# Patient Record
Sex: Female | Born: 1950 | Race: White | Hispanic: No | Smoking: Former smoker
Health system: Southern US, Community
[De-identification: ages and names within clinical notes are randomized; demographics above are authoritative.]

## PROBLEM LIST (undated history)

## (undated) DIAGNOSIS — Z9889 Other specified postprocedural states: Secondary | ICD-10-CM

## (undated) DIAGNOSIS — IMO0001 Reserved for inherently not codable concepts without codable children: Secondary | ICD-10-CM

## (undated) DIAGNOSIS — E049 Nontoxic goiter, unspecified: Secondary | ICD-10-CM

## (undated) DIAGNOSIS — M1990 Unspecified osteoarthritis, unspecified site: Secondary | ICD-10-CM

## (undated) DIAGNOSIS — R112 Nausea with vomiting, unspecified: Secondary | ICD-10-CM

## (undated) DIAGNOSIS — K219 Gastro-esophageal reflux disease without esophagitis: Secondary | ICD-10-CM

## (undated) DIAGNOSIS — F329 Major depressive disorder, single episode, unspecified: Secondary | ICD-10-CM

## (undated) DIAGNOSIS — J45909 Unspecified asthma, uncomplicated: Secondary | ICD-10-CM

## (undated) DIAGNOSIS — G4730 Sleep apnea, unspecified: Secondary | ICD-10-CM

## (undated) DIAGNOSIS — Z85828 Personal history of other malignant neoplasm of skin: Secondary | ICD-10-CM

## (undated) DIAGNOSIS — R200 Anesthesia of skin: Secondary | ICD-10-CM

## (undated) DIAGNOSIS — M8580 Other specified disorders of bone density and structure, unspecified site: Secondary | ICD-10-CM

## (undated) DIAGNOSIS — F32A Depression, unspecified: Secondary | ICD-10-CM

## (undated) DIAGNOSIS — E7800 Pure hypercholesterolemia, unspecified: Secondary | ICD-10-CM

## (undated) DIAGNOSIS — C4490 Unspecified malignant neoplasm of skin, unspecified: Secondary | ICD-10-CM

## (undated) DIAGNOSIS — G47419 Narcolepsy without cataplexy: Secondary | ICD-10-CM

## (undated) HISTORY — DX: Unspecified malignant neoplasm of skin, unspecified: C44.90

## (undated) HISTORY — PX: COLONOSCOPY: SHX5424

## (undated) HISTORY — PX: FRACTURE SURGERY: SHX138

## (undated) HISTORY — PX: ABDOMINAL HYSTERECTOMY: SHX81

## (undated) HISTORY — PX: KNEE ARTHROSCOPY: SUR90

---

## 2008-01-07 HISTORY — PX: ANKLE FRACTURE SURGERY: SHX122

## 2011-07-24 ENCOUNTER — Observation Stay (HOSPITAL_COMMUNITY)
Admission: EM | Admit: 2011-07-24 | Discharge: 2011-07-25 | DRG: 227 | Disposition: A | Discharge status: Home / Self Care | Payer: BC Managed Care – PPO | Attending: Orthopedic Surgery | Admitting: Orthopedic Surgery

## 2011-07-24 ENCOUNTER — Encounter (HOSPITAL_COMMUNITY): Payer: Self-pay | Admitting: Anesthesiology

## 2011-07-24 ENCOUNTER — Emergency Department (HOSPITAL_COMMUNITY): Payer: BC Managed Care – PPO

## 2011-07-24 ENCOUNTER — Encounter (HOSPITAL_COMMUNITY): Payer: Self-pay | Admitting: *Deleted

## 2011-07-24 ENCOUNTER — Emergency Department (HOSPITAL_COMMUNITY): Payer: BC Managed Care – PPO | Admitting: Anesthesiology

## 2011-07-24 ENCOUNTER — Encounter (HOSPITAL_COMMUNITY)
Admission: EM | Disposition: A | Discharge status: Home / Self Care | Payer: Self-pay | Source: Home / Self Care | Attending: Emergency Medicine

## 2011-07-24 DIAGNOSIS — Z0389 Encounter for observation for other suspected diseases and conditions ruled out: Secondary | ICD-10-CM | POA: Diagnosis not present

## 2011-07-24 DIAGNOSIS — G47419 Narcolepsy without cataplexy: Secondary | ICD-10-CM

## 2011-07-24 DIAGNOSIS — Y9229 Other specified public building as the place of occurrence of the external cause: Secondary | ICD-10-CM | POA: Diagnosis not present

## 2011-07-24 DIAGNOSIS — E7800 Pure hypercholesterolemia, unspecified: Secondary | ICD-10-CM | POA: Diagnosis not present

## 2011-07-24 DIAGNOSIS — W010XXA Fall on same level from slipping, tripping and stumbling without subsequent striking against object, initial encounter: Secondary | ICD-10-CM | POA: Diagnosis not present

## 2011-07-24 DIAGNOSIS — S82009A Unspecified fracture of unspecified patella, initial encounter for closed fracture: Principal | ICD-10-CM | POA: Insufficient documentation

## 2011-07-24 DIAGNOSIS — M79609 Pain in unspecified limb: Secondary | ICD-10-CM | POA: Diagnosis present

## 2011-07-24 HISTORY — DX: Pure hypercholesterolemia, unspecified: E78.00

## 2011-07-24 HISTORY — DX: Unspecified osteoarthritis, unspecified site: M19.90

## 2011-07-24 HISTORY — PX: PATELLECTOMY: SHX1022

## 2011-07-24 HISTORY — DX: Narcolepsy without cataplexy: G47.419

## 2011-07-24 LAB — CBC
HCT: 39.2 % (ref 36.0–46.0)
Hemoglobin: 13 g/dL (ref 12.0–15.0)
MCH: 29 pg (ref 26.0–34.0)
MCV: 87.3 fL (ref 78.0–100.0)
RBC: 4.49 MIL/uL (ref 3.87–5.11)

## 2011-07-24 LAB — BASIC METABOLIC PANEL
BUN: 15 mg/dL (ref 6–23)
CO2: 23 mEq/L (ref 19–32)
Calcium: 9.5 mg/dL (ref 8.4–10.5)
Glucose, Bld: 108 mg/dL — ABNORMAL HIGH (ref 70–99)
Sodium: 138 mEq/L (ref 135–145)

## 2011-07-24 LAB — PROTIME-INR: INR: 0.97 (ref 0.00–1.49)

## 2011-07-24 SURGERY — PATELLECTOMY
Anesthesia: General | Site: Knee | Laterality: Left | Wound class: Clean

## 2011-07-24 MED ORDER — AMPHETAMINE-DEXTROAMPHETAMINE 20 MG PO TABS
20.0000 mg | ORAL_TABLET | Freq: Every day | ORAL | Status: DC
  Administered 2011-07-25: 20 mg via ORAL
  Filled 2011-07-24: qty 1

## 2011-07-24 MED ORDER — KETOROLAC TROMETHAMINE 30 MG/ML IJ SOLN
15.0000 mg | Freq: Once | INTRAMUSCULAR | Status: AC | PRN
  Administered 2011-07-24: 30 mg via INTRAVENOUS

## 2011-07-24 MED ORDER — CELECOXIB 200 MG PO CAPS
200.0000 mg | ORAL_CAPSULE | Freq: Two times a day (BID) | ORAL | Status: DC
  Administered 2011-07-25 (×2): 200 mg via ORAL
  Filled 2011-07-24 (×3): qty 1

## 2011-07-24 MED ORDER — DOCUSATE SODIUM 100 MG PO CAPS
100.0000 mg | ORAL_CAPSULE | Freq: Two times a day (BID) | ORAL | Status: DC
  Administered 2011-07-25 (×2): 100 mg via ORAL

## 2011-07-24 MED ORDER — ACETAMINOPHEN 10 MG/ML IV SOLN
INTRAVENOUS | Status: AC
  Filled 2011-07-24: qty 100

## 2011-07-24 MED ORDER — ONDANSETRON HCL 4 MG PO TABS: 4.0000 mg | ORAL_TABLET | Freq: Four times a day (QID) | ORAL | Status: DC | PRN

## 2011-07-24 MED ORDER — KETOROLAC TROMETHAMINE 30 MG/ML IJ SOLN
INTRAMUSCULAR | Status: AC
  Filled 2011-07-24: qty 1

## 2011-07-24 MED ORDER — DIPHENHYDRAMINE HCL 50 MG/ML IJ SOLN
INTRAMUSCULAR | Status: DC | PRN
  Administered 2011-07-24: 12.5 mg via INTRAVENOUS

## 2011-07-24 MED ORDER — METHOCARBAMOL 500 MG PO TABS
500.0000 mg | ORAL_TABLET | Freq: Four times a day (QID) | ORAL | Status: DC | PRN
  Administered 2011-07-25: 500 mg via ORAL
  Filled 2011-07-24: qty 1

## 2011-07-24 MED ORDER — PROMETHAZINE HCL 25 MG/ML IJ SOLN: 6.2500 mg | INTRAMUSCULAR | Status: DC | PRN

## 2011-07-24 MED ORDER — CEFAZOLIN SODIUM 1-5 GM-% IV SOLN
INTRAVENOUS | Status: DC | PRN
  Administered 2011-07-24: 2 g via INTRAVENOUS

## 2011-07-24 MED ORDER — ACETAMINOPHEN 10 MG/ML IV SOLN
INTRAVENOUS | Status: DC | PRN
  Administered 2011-07-24: 1000 mg via INTRAVENOUS

## 2011-07-24 MED ORDER — METHOCARBAMOL 100 MG/ML IJ SOLN
500.0000 mg | Freq: Four times a day (QID) | INTRAVENOUS | Status: DC | PRN
  Filled 2011-07-24: qty 5

## 2011-07-24 MED ORDER — HYDROMORPHONE HCL PF 1 MG/ML IJ SOLN
1.0000 mg | Freq: Once | INTRAMUSCULAR | Status: AC
  Administered 2011-07-24: 1 mg via INTRAVENOUS
  Filled 2011-07-24: qty 1

## 2011-07-24 MED ORDER — LACTATED RINGERS IV SOLN: INTRAVENOUS | Status: DC

## 2011-07-24 MED ORDER — HYDROMORPHONE HCL PF 1 MG/ML IJ SOLN: 0.2500 mg | INTRAMUSCULAR | Status: DC | PRN

## 2011-07-24 MED ORDER — TEMAZEPAM 15 MG PO CAPS: 15.0000 mg | ORAL_CAPSULE | Freq: Every evening | ORAL | Status: DC | PRN

## 2011-07-24 MED ORDER — MIDAZOLAM HCL 5 MG/5ML IJ SOLN
INTRAMUSCULAR | Status: DC | PRN
  Administered 2011-07-24: 2 mg via INTRAVENOUS

## 2011-07-24 MED ORDER — ATORVASTATIN CALCIUM 40 MG PO TABS
40.0000 mg | ORAL_TABLET | Freq: Every day | ORAL | Status: DC
  Filled 2011-07-24: qty 1

## 2011-07-24 MED ORDER — DIPHENHYDRAMINE HCL 12.5 MG/5ML PO ELIX
12.5000 mg | ORAL_SOLUTION | ORAL | Status: DC | PRN
Start: 2011-07-24 — End: 2011-07-25

## 2011-07-24 MED ORDER — FENTANYL CITRATE 0.05 MG/ML IJ SOLN
INTRAMUSCULAR | Status: DC | PRN
  Administered 2011-07-24 (×2): 50 ug via INTRAVENOUS

## 2011-07-24 MED ORDER — LIDOCAINE HCL (CARDIAC) 20 MG/ML IV SOLN
INTRAVENOUS | Status: DC | PRN
  Administered 2011-07-24: 100 mg via INTRAVENOUS

## 2011-07-24 MED ORDER — DEXAMETHASONE SODIUM PHOSPHATE 10 MG/ML IJ SOLN
INTRAMUSCULAR | Status: DC | PRN
  Administered 2011-07-24: 10 mg via INTRAVENOUS

## 2011-07-24 MED ORDER — VENLAFAXINE HCL ER 37.5 MG PO CP24
37.5000 mg | ORAL_CAPSULE | Freq: Every day | ORAL | Status: DC
  Administered 2011-07-25: 37.5 mg via ORAL
  Filled 2011-07-24: qty 1

## 2011-07-24 MED ORDER — MODAFINIL 200 MG PO TABS
200.0000 mg | ORAL_TABLET | Freq: Every day | ORAL | Status: DC
  Administered 2011-07-25: 200 mg via ORAL
  Filled 2011-07-24: qty 1

## 2011-07-24 MED ORDER — METOCLOPRAMIDE HCL 10 MG PO TABS: 5.0000 mg | ORAL_TABLET | Freq: Three times a day (TID) | ORAL | Status: DC | PRN

## 2011-07-24 MED ORDER — METOCLOPRAMIDE HCL 5 MG/ML IJ SOLN: 5.0000 mg | Freq: Three times a day (TID) | INTRAMUSCULAR | Status: DC | PRN

## 2011-07-24 MED ORDER — ONDANSETRON HCL 4 MG/2ML IJ SOLN: 4.0000 mg | Freq: Four times a day (QID) | INTRAMUSCULAR | Status: DC | PRN

## 2011-07-24 MED ORDER — CEFAZOLIN SODIUM-DEXTROSE 2-3 GM-% IV SOLR
2.0000 g | Freq: Four times a day (QID) | INTRAVENOUS | Status: DC
  Administered 2011-07-25 (×2): 2 g via INTRAVENOUS
  Filled 2011-07-24 (×3): qty 50

## 2011-07-24 MED ORDER — ONDANSETRON HCL 4 MG/2ML IJ SOLN
INTRAMUSCULAR | Status: DC | PRN
  Administered 2011-07-24: 4 mg via INTRAVENOUS

## 2011-07-24 MED ORDER — BUPIVACAINE HCL (PF) 0.25 % IJ SOLN
INTRAMUSCULAR | Status: DC | PRN
  Administered 2011-07-24: 30 mL

## 2011-07-24 MED ORDER — SUCCINYLCHOLINE CHLORIDE 20 MG/ML IJ SOLN
INTRAMUSCULAR | Status: DC | PRN
  Administered 2011-07-24: 100 mg via INTRAVENOUS

## 2011-07-24 MED ORDER — LACTATED RINGERS IV SOLN
INTRAVENOUS | Status: DC | PRN
  Administered 2011-07-24: 21:00:00 via INTRAVENOUS

## 2011-07-24 MED ORDER — 0.9 % SODIUM CHLORIDE (POUR BTL) OPTIME
TOPICAL | Status: DC | PRN
  Administered 2011-07-24: 1000 mL

## 2011-07-24 MED ORDER — OXYCODONE-ACETAMINOPHEN 5-325 MG PO TABS
1.0000 | ORAL_TABLET | ORAL | Status: DC | PRN
  Administered 2011-07-25: 2 via ORAL
  Administered 2011-07-25: 1 via ORAL
  Administered 2011-07-25: 2 via ORAL
  Filled 2011-07-24: qty 1
  Filled 2011-07-24 (×2): qty 2

## 2011-07-24 MED ORDER — BUPIVACAINE HCL (PF) 0.25 % IJ SOLN
INTRAMUSCULAR | Status: AC
  Filled 2011-07-24: qty 30

## 2011-07-24 MED ORDER — AMPHETAMINE-DEXTROAMPHET ER 20 MG PO CP24
20.0000 mg | ORAL_CAPSULE | ORAL | Status: DC
  Filled 2011-07-24: qty 1

## 2011-07-24 MED ORDER — PROPOFOL 10 MG/ML IV EMUL
INTRAVENOUS | Status: DC | PRN
  Administered 2011-07-24: 150 mg via INTRAVENOUS

## 2011-07-24 MED ORDER — ONDANSETRON HCL 4 MG/2ML IJ SOLN
4.0000 mg | Freq: Once | INTRAMUSCULAR | Status: AC
  Administered 2011-07-24: 4 mg via INTRAVENOUS
  Filled 2011-07-24: qty 2

## 2011-07-24 MED ORDER — CEFAZOLIN SODIUM-DEXTROSE 2-3 GM-% IV SOLR
INTRAVENOUS | Status: AC
  Filled 2011-07-24: qty 50

## 2011-07-24 MED ORDER — HYDROMORPHONE HCL PF 1 MG/ML IJ SOLN: 0.5000 mg | INTRAMUSCULAR | Status: DC | PRN

## 2011-07-24 MED ORDER — BUPIVACAINE-EPINEPHRINE 0.25% -1:200000 IJ SOLN
INTRAMUSCULAR | Status: AC
  Filled 2011-07-24: qty 1

## 2011-07-24 SURGICAL SUPPLY — 56 items
BAG ZIPLOCK 12X15 (MISCELLANEOUS) IMPLANT
BANDAGE ELASTIC 6 VELCRO ST LF (GAUZE/BANDAGES/DRESSINGS) ×3 IMPLANT
BANDAGE ESMARK 6X9 LF (GAUZE/BANDAGES/DRESSINGS) ×2 IMPLANT
BANDAGE GAUZE ELAST BULKY 4 IN (GAUZE/BANDAGES/DRESSINGS) IMPLANT
BIT DRILL 2.4X128 (BIT) ×3 IMPLANT
BNDG COHESIVE 6X5 TAN STRL LF (GAUZE/BANDAGES/DRESSINGS) ×3 IMPLANT
BNDG ESMARK 6X9 LF (GAUZE/BANDAGES/DRESSINGS) ×3
CLOTH BEACON ORANGE TIMEOUT ST (SAFETY) ×3 IMPLANT
CLSR STERI-STRIP ANTIMIC 1/2X4 (GAUZE/BANDAGES/DRESSINGS) ×3 IMPLANT
DECANTER SPIKE VIAL GLASS SM (MISCELLANEOUS) IMPLANT
DRAPE C-ARM 42X72 X-RAY (DRAPES) IMPLANT
DRAPE ORTHO SPLIT 77X108 STRL (DRAPES) ×2
DRAPE POUCH INSTRU U-SHP 10X18 (DRAPES) ×3 IMPLANT
DRAPE SURG ORHT 6 SPLT 77X108 (DRAPES) ×4 IMPLANT
DRSG ADAPTIC 3X8 NADH LF (GAUZE/BANDAGES/DRESSINGS) ×3 IMPLANT
DRSG EMULSION OIL 3X16 NADH (GAUZE/BANDAGES/DRESSINGS) ×3 IMPLANT
DRSG PAD ABDOMINAL 8X10 ST (GAUZE/BANDAGES/DRESSINGS) ×6 IMPLANT
DURAPREP 26ML APPLICATOR (WOUND CARE) ×3 IMPLANT
ELECT REM PT RETURN 9FT ADLT (ELECTROSURGICAL) ×3
ELECTRODE REM PT RTRN 9FT ADLT (ELECTROSURGICAL) ×2 IMPLANT
EVACUATOR 1/8 PVC DRAIN (DRAIN) IMPLANT
EVACUATOR SILICONE 100CC (DRAIN) IMPLANT
GLOVE BIO SURGEON STRL SZ7.5 (GLOVE) ×3 IMPLANT
GLOVE BIO SURGEON STRL SZ8 (GLOVE) ×3 IMPLANT
GLOVE BIOGEL PI IND STRL 7.0 (GLOVE) ×2 IMPLANT
GLOVE BIOGEL PI INDICATOR 7.0 (GLOVE) ×1
GLOVE ECLIPSE 7.5 STRL STRAW (GLOVE) ×3 IMPLANT
GLOVE EUDERMIC 7 POWDERFREE (GLOVE) ×3 IMPLANT
GLOVE SURG SS PI 7.0 STRL IVOR (GLOVE) ×3 IMPLANT
GOWN STRL NON-REIN LRG LVL3 (GOWN DISPOSABLE) ×9 IMPLANT
IMMOBILIZER KNEE 22 UNIV (SOFTGOODS) ×3 IMPLANT
MANIFOLD NEPTUNE II (INSTRUMENTS) IMPLANT
NEEDLE ANCHOR KEITH 2 7/8 STR (NEEDLE) ×3 IMPLANT
NEEDLE HYPO 22GX1.5 SAFETY (NEEDLE) ×3 IMPLANT
NS IRRIG 1000ML POUR BTL (IV SOLUTION) ×3 IMPLANT
PACK LOWER EXTREMITY WL (CUSTOM PROCEDURE TRAY) ×3 IMPLANT
PADDING CAST ABS 6INX4YD NS (CAST SUPPLIES) ×1
PADDING CAST ABS COTTON 6X4 NS (CAST SUPPLIES) ×2 IMPLANT
PASSER SUT SWANSON 36MM LOOP (INSTRUMENTS) IMPLANT
POSITIONER SURGICAL ARM (MISCELLANEOUS) ×3 IMPLANT
SPONGE GAUZE 4X4 12PLY (GAUZE/BANDAGES/DRESSINGS) IMPLANT
SPONGE LAP 18X18 X RAY DECT (DISPOSABLE) ×6 IMPLANT
SPONGE LAP 4X18 X RAY DECT (DISPOSABLE) IMPLANT
SPONGE SURGIFOAM ABS GEL 100 (HEMOSTASIS) IMPLANT
STAPLER VISISTAT 35W (STAPLE) IMPLANT
STRIP CLOSURE SKIN 1/2X4 (GAUZE/BANDAGES/DRESSINGS) ×6 IMPLANT
SUT BONE WAX W31G (SUTURE) IMPLANT
SUT MNCRL AB 3-0 PS2 18 (SUTURE) ×3 IMPLANT
SUT VIC AB 0 CT1 27 (SUTURE)
SUT VIC AB 0 CT1 27XBRD ANTBC (SUTURE) IMPLANT
SUT VIC AB 1 CT1 27 (SUTURE) ×2
SUT VIC AB 1 CT1 27XBRD ANTBC (SUTURE) ×4 IMPLANT
SUT VIC AB 2-0 CT1 27 (SUTURE) ×2
SUT VIC AB 2-0 CT1 TAPERPNT 27 (SUTURE) ×4 IMPLANT
SYR CONTROL 10ML LL (SYRINGE) ×3 IMPLANT
WATER STERILE IRR 1500ML POUR (IV SOLUTION) IMPLANT

## 2011-07-24 NOTE — Anesthesia Postprocedure Evaluation (Signed)
  Anesthesia Post-op Note  Patient: Carla [name]  Procedure(s) Performed: Procedure(s) (LRB): PATELLECTOMY (Left)  Patient Location: PACU  Anesthesia Type: General  Level of Consciousness: awake and alert   Airway and Oxygen Therapy: Patient Spontanous Breathing  Post-op Pain: mild  Post-op Assessment: Post-op Vital signs reviewed, Patient's Cardiovascular Status Stable, Respiratory Function Stable, Patent Airway and No signs of Nausea or vomiting  Post-op Vital Signs: stable  Complications: No apparent anesthesia complications

## 2011-07-24 NOTE — Preoperative (Signed)
Beta Blockers   Reason not to administer Beta Blockers:Not Applicable 

## 2011-07-24 NOTE — ED Notes (Signed)
Pt slipped at [facility] on water and injured left knee. EMS reports obvious deformity noted. 22G L hand by EMS.

## 2011-07-24 NOTE — Transfer of Care (Signed)
Immediate Anesthesia Transfer of Care Note  Patient: Carla [name]  Procedure(s) Performed: Procedure(s) (LRB): PATELLECTOMY (Left)  Patient Location: PACU  Anesthesia Type: General  Level of Consciousness: awake, alert , oriented and patient cooperative  Airway & Oxygen Therapy: Patient Spontanous Breathing and Patient connected to face mask oxygen  Post-op Assessment: Report given to PACU RN, Post -op Vital signs reviewed and stable and Patient moving all extremities  Post vital signs: Reviewed  Complications: No apparent anesthesia complications

## 2011-07-24 NOTE — H&P (Signed)
Carla [name]   Chief Complaint: fractured left patella HPI: The patient is a 61 y.o. female patient reports slipping in a puddle of water on the floor at [employer] and falling directly onto her left knee. Denies other injuries sustained in the fall. Heard a "crunch" and has not ambulated since the fall. There is associated swelling. Denies numbness distal to the injury. Pain is severe and constant. Worse with attempted movement. Temporarily relieved with fentanyl administered by EMS.   Past Medical History  Diagnosis Date  . Narcolepsy   . Hypercholesteremia   . Arthritis     Past Surgical History  Procedure Date  . Abdominal hysterectomy     History reviewed. No pertinent family history.  Social History:  does not have a smoking history on file. She does not have any smokeless tobacco history on file. She reports that she drinks alcohol. She reports that she does not use illicit drugs.  Allergies: No Known Allergies   (Not in a hospital admission)   Physical Exam: [facility] A and O. Ortho exam limited to LLE with prepatellar ecchymosis and marked swelling, skin intact. N/V intact distally.  Vitals  Temp:  [97.9 F (36.6 C)] 97.9 F (36.6 C) (07/18 1732) Pulse Rate:  [78] 78  (07/18 1732) Resp:  [15] 15  (07/18 1732) BP: (140)/(59) 140/59 mmHg (07/18 1732) SpO2:  [98 %-99 %] 98 % (07/18 1732)  Assessment/Plan  Impression: fractured left patella  Plan of Action: Procedure(s): OPEN REDUCTION INTERNAL (ORIF) FIXATION PATELLA  Sammye Staff M 07/24/2011, 8:16 PM

## 2011-07-24 NOTE — Anesthesia Preprocedure Evaluation (Addendum)
Anesthesia Evaluation  Patient identified by MRN, date of birth, ID band Patient awake    Reviewed: Allergy & Precautions, H&P , NPO status , Patient's Chart, lab work & pertinent test results  Airway Mallampati: II TM Distance: <3 FB Neck ROM: Full    Dental No notable dental hx.    Pulmonary shortness of breath and lying,  breath sounds clear to auscultation  Pulmonary exam normal       Cardiovascular negative cardio ROS  Rhythm:Regular Rate:Normal     Neuro/Psych narcolepsy negative psych ROS   GI/Hepatic negative GI ROS, Neg liver ROS,   Endo/Other  Morbid obesity  Renal/GU negative Renal ROS  negative genitourinary   Musculoskeletal negative musculoskeletal ROS (+)   Abdominal   Peds negative pediatric ROS (+)  Hematology negative hematology ROS (+)   Anesthesia Other Findings   Reproductive/Obstetrics negative OB ROS                          Anesthesia Physical Anesthesia Plan  ASA: II and Emergent  Anesthesia Plan: General   Post-op Pain Management:    Induction: Intravenous  Airway Management Planned: Oral ETT  Additional Equipment:   Intra-op Plan:   Post-operative Plan: Extubation in OR  Informed Consent: I have reviewed the patients History and Physical, chart, labs and discussed the procedure including the risks, benefits and alternatives for the proposed anesthesia with the patient or authorized representative who has indicated his/her understanding and acceptance.   Dental advisory given  Plan Discussed with: CRNA and Surgeon  Anesthesia Plan Comments:        Anesthesia Quick Evaluation

## 2011-07-24 NOTE — Op Note (Signed)
07/24/2011  10:20 PM  PATIENT:   Carla [name]  61 y.o. female  PRE-OPERATIVE DIAGNOSIS:  fractured left patella  POST-OPERATIVE DIAGNOSIS:  same  PROCEDURE:  Partial patellectomy and repair of infrapatellar tendon  SURGEON:  Shayonna Ocampo, Vania Rea M.D.  ASSISTANTS: Shuford pac   ANESTHESIA:   GET + local  EBL: min  SPECIMEN:  none  Drains: none   PATIENT DISPOSITION:  PACU - hemodynamically stable.    PLAN OF CARE: Admit for overnight observation  Dictation# [id number] + [id number]

## 2011-07-24 NOTE — ED Provider Notes (Addendum)
History     CSN: [id number]  Arrival date & time 07/24/11  1724   First MD Initiated Contact with Patient 07/24/11 1744      Chief Complaint  Patient presents with  . Fall  . Knee Injury    (Consider location/radiation/quality/duration/timing/severity/associated sxs/prior treatment) Patient is a 61 y.o. female presenting with fall. The history is provided by the patient.  Fall The accident occurred less than 1 hour ago. The fall occurred while walking. There was no blood loss.   patient reports slipping in a puddle of water on the floor at [employer] and falling directly onto her left knee. Denies other injuries sustained in the fall. Heard a "crunch" and has not ambulated since the fall. There is associated swelling. Denies numbness distal to the injury. Pain is severe and constant. Worse with attempted movement. Temporarily relieved with fentanyl administered by EMS.  Past Medical History  Diagnosis Date  . Narcolepsy   . Hypercholesteremia   . Arthritis     Past Surgical History  Procedure Date  . Abdominal hysterectomy     History reviewed. No pertinent family history.  History  Substance Use Topics  . Smoking status: Not on file  . Smokeless tobacco: Not on file  . Alcohol Use: Yes     Review of Systems 10 systems reviewed and are negative for acute change except as noted in the HPI.  Allergies  Review of patient's allergies indicates no known allergies.  Home Medications   Current Outpatient Rx  Name Route Sig Dispense Refill  . AMPHETAMINE-DEXTROAMPHET ER 20 MG PO CP24 Oral Take 20 mg by mouth every morning.    Marland Kitchen AMPHETAMINE-DEXTROAMPHETAMINE 20 MG PO TABS Oral Take 20 mg by mouth daily.    . ATORVASTATIN CALCIUM 40 MG PO TABS Oral Take 40 mg by mouth daily.    . CELECOXIB 200 MG PO CAPS Oral Take 200 mg by mouth 2 (two) times daily.    Marland Kitchen MODAFINIL 200 MG PO TABS Oral Take 200 mg by mouth daily.    . VENLAFAXINE HCL ER 37.5 MG PO CP24 Oral Take 37.5  mg by mouth daily.      BP 140/59  Pulse 78  Temp 97.9 F (36.6 C) (Oral)  Resp 15  SpO2 98%  Physical Exam  Nursing note and vitals reviewed. Constitutional: She is oriented to person, place, and time. She appears well-developed and well-nourished.       Uncomfortable appearing.  HENT:  Head: Normocephalic and atraumatic.  Right Ear: External ear normal.  Left Ear: External ear normal.       MMM  Eyes: Conjunctivae are normal. Pupils are equal, round, and reactive to light.  Neck: Neck supple.  Cardiovascular: Normal rate, regular rhythm and normal heart sounds.   Pulses:      Dorsalis pedis pulses are 2+ on the right side, and 2+ on the left side.  Pulmonary/Chest: Effort normal and breath sounds normal. No respiratory distress. She has no wheezes.  Abdominal: Soft. Bowel sounds are normal. She exhibits no distension. There is no tenderness.  Musculoskeletal: She exhibits edema and tenderness.       Left hip: She exhibits no tenderness.       Left knee: She exhibits decreased range of motion, swelling, effusion and abnormal patellar mobility. tenderness found. Patellar tendon tenderness noted. Medial joint line tenderness: minimal. Lateral joint line tenderness: Minimal.       Left ankle: Normal.       Legs:  Neurological: She is alert and oriented to person, place, and time.       Sensation intact to light touch in bilateral lower extremities. 5 out of 5 plantar and dorsi flexion bilaterally.  Skin: Skin is warm and dry.       No wounds seen.    ED Course  Procedures (including critical care time)   Labs Reviewed  CBC  BASIC METABOLIC PANEL  PROTIME-INR  APTT   Dg Chest 1 View  07/24/2011  *RADIOLOGY REPORT*  Clinical Data: Fall.  Knee injury.  Possible surgery tonight.  CHEST - 1 VIEW  Comparison: None.  Findings: Heart size is normal.  The lungs are clear.  The visualized soft tissues and bony thorax are unremarkable.  IMPRESSION: Negative chest.  Original Report  Authenticated By: Jamesetta Orleans. MATTERN, M.D.   Dg Knee Complete 4 Views Left  07/24/2011  *RADIOLOGY REPORT*  Clinical Data: Fall, knee pain  LEFT KNEE - COMPLETE 4+ VIEW  Comparison: None.  Findings: There is a fracture of the patella.  The superior aspect of the patella is retracted superiorly.  There is a large prepatellar bursa  effusion.  No evidence of fracture of the femur or tibia.  IMPRESSION: Patellar fracture with retraction.  Original Report Authenticated By: Genevive Bi, M.D.    Date: 08/06/2011  Rate: 70  Rhythm: normal sinus rhythm  QRS Axis: normal  Intervals: normal  ST/T Wave abnormalities: normal  Conduction Disutrbances:none  Narrative Interpretation: pre-op order  Old EKG Reviewed: none available    1. Closed fracture of patella       MDM  Mechanical fall, left knee injury. Patellar fracture seen on plain films. Distal neurovascular status intact. Dr. supple was consulted and will come to see in the emergency department. Preoperative orders are placed per request. Pain control in ED with Dilaudid.        Shaaron Adler, PA-C 07/24/11 1925  Shaaron Adler, PA-C 08/06/11 [phone]

## 2011-07-24 NOTE — ED Notes (Signed)
Patient transported to OR via stretcher.

## 2011-07-24 NOTE — ED Notes (Signed)
Supple, MD at bedside.

## 2011-07-24 NOTE — ED Notes (Addendum)
Patient on bed pan - will collect labs when finished.

## 2011-07-24 NOTE — ED Notes (Signed)
ZOX:[id number]<AV> Expected date:07/24/11<BR> Expected time: 5:06 PM<BR> Means of arrival:Ambulance<BR> Comments:<BR> Fall, knee deformity

## 2011-07-24 NOTE — ED Notes (Addendum)
Pt states she was walking at [employer] when she slipped on water. Pt reports landing directly on left knee and hearing "a pop." Pt denies hitting head or LOC.

## 2011-07-24 NOTE — ED Notes (Signed)
Patient transported to X-ray 

## 2011-07-25 ENCOUNTER — Encounter (HOSPITAL_COMMUNITY): Payer: Self-pay | Admitting: Orthopedic Surgery

## 2011-07-25 DIAGNOSIS — G47419 Narcolepsy without cataplexy: Secondary | ICD-10-CM

## 2011-07-25 MED ORDER — OXYCODONE-ACETAMINOPHEN 5-325 MG PO TABS: 1.0000 | ORAL_TABLET | ORAL | Status: AC | PRN

## 2011-07-25 MED ORDER — DIAZEPAM 5 MG PO TABS: 5.0000 mg | ORAL_TABLET | Freq: Four times a day (QID) | ORAL | Status: AC | PRN

## 2011-07-25 NOTE — Care Management Note (Signed)
    Page 1 of 2   07/25/2011     1:19:15 PM   CARE MANAGEMENT NOTE 07/25/2011  Patient:  CHRYSTIAN, RESSLER   Account Number:  [id number]  Date Initiated:  07/25/2011  Documentation initiated by:  Colleen Can  Subjective/Objective Assessment:   dx fracture of left patella following a fall; partial pallatectomy and repair of tendon     Action/Plan:   Cm spoke with patient. Plans are for patient to go to her daughter's home while she recuperates. Daughter will be caregver. Will need RW, 3n1, shower bench   Anticipated DC Date:  07/25/2011   Anticipated DC Plan:  HOME/SELF CARE  In-house referral  NA      DC Planning Services  CM consult      PAC Choice  DURABLE MEDICAL EQUIPMENT   Choice offered to / List presented to:  NA   DME arranged  3-N-1  WALKER - ROLLING  TUB BENCH      DME agency  [employer]     HH arranged  NA      HH agency  NA   Status of service:  Completed, signed off Medicare Important Message given?  NO (If response is "NO", the following Medicare IM given date fields will be blank) Date Medicare IM given:   Date Additional Medicare IM given:    Discharge Disposition:  HOME/SELF CARE  Per UR Regulation:    If discussed at Long Length of Stay Meetings, dates discussed:    Comments:

## 2011-07-25 NOTE — Evaluation (Addendum)
Physical Therapy Evaluation Patient Details Name: Carla [name] MRN: [id number] DOB: February 01, 1950 Today's Date: 07/25/2011 Time: 6962-9528 PT Time Calculation (min): 37 min  PT Assessment / Plan / Recommendation Clinical Impression  61 yo female s/p partial patellectomy and repair of infrapatellar tendon. Mobilizing well. Completed all education.Practiced steps with daughter present. No further PT needs at this time. 1x eval. Pt with questions about shower seat. Informed pt that they could be purchased at local drugstore/medical supply, if necessary. Also informed pt that she could use 3n1 in shower as well, if it will fit.     PT Assessment  Patent does not need any further PT services    Follow Up Recommendations  No PT follow up    Barriers to Discharge        Equipment Recommendations  Rolling walker with 5" wheels;3 in 1 bedside comode; Shower seat    Recommendations for Other Services     Frequency      Precautions / Restrictions Precautions Precautions: Knee Precaution Comments: No knee flexion Required Braces or Orthoses: Knee Immobilizer - Left Knee Immobilizer - Left: On at all times Restrictions Weight Bearing Restrictions: No LLE Weight Bearing: Weight bearing as tolerated   Pertinent Vitals/Pain       Mobility  Bed Mobility Bed Mobility: Supine to Sit Supine to Sit: HOB elevated;With rails;4: Min assist Details for Bed Mobility Assistance: assist for L LE off bed. VCs safety, technique. Also informed pt of possibly being able to use sheet to assist with L LE onto/off bed Transfers Transfers: Sit to Stand;Stand to Sit Sit to Stand: 4: Min guard;With upper extremity assist;With armrests;From bed;From toilet Stand to Sit: 4: Min guard;To chair/3-in-1;With armrests;With upper extremity assist Details for Transfer Assistance: x 2. VCs safety, technique, hand placement.  Ambulation/Gait Ambulation/Gait Assistance: 4: Min guard Ambulation Distance (Feet): 100  Feet Assistive device: Rolling walker Ambulation/Gait Assistance Details: VCs safety, posture, sequence.  Gait Pattern: Step-to pattern Stairs: Yes Stairs Assistance: 4: Min assist Stairs Assistance Details (indicate cue type and reason): VCs safety, technique, hand placement. Daughter present. Issued handout as well.  Stair Management Technique: Forwards;Step to pattern;One rail Left;With crutches Number of Stairs: 4     Exercises     PT Diagnosis:    PT Problem List:   PT Treatment Interventions:     PT Goals    Visit Information  Last PT Received On: 07/25/11 Assistance Needed: +1    Subjective Data  Subjective: "It's burning some now" Patient Stated Goal: Home   Prior Functioning  Home Living Lives With: Alone Available Help at Discharge: Family Type of Home: House Home Access: Stairs to enter Secretary/administrator of Steps: 3-garage  Entrance Stairs-Rails: Left Home Layout: Two level;1/2 bath on main level Home Adaptive Equipment: None Additional Comments: Daughter-Shannon (pt will be going there....home living info). Will be putting bed on 1st level. Prior Function Level of Independence: Independent Able to Take Stairs?: Yes Driving: Yes Communication Communication: No difficulties    Cognition  Overall Cognitive Status: Appears within functional limits for tasks assessed/performed Arousal/Alertness: Awake/alert Orientation Level: Appears intact for tasks assessed Behavior During Session: [facility] for tasks performed    Extremity/Trunk Assessment Right Lower Extremity Assessment RLE ROM/Strength/Tone: [facility] for tasks assessed RLE ROM/Strength/Tone Deficits: except ankle-limited ROM due to prior surgery Left Lower Extremity Assessment LLE ROM/Strength/Tone Deficits: Able to move L ankle but ROM limtied due to prior surgery on ankle   Balance    End of Session PT - End of  Session Equipment Utilized During Treatment: Gait belt;Left knee immobilizer Activity  Tolerance: Patient tolerated treatment well Patient left: in chair;with family/visitor present;with call bell/phone within reach  GP     Rebeca Alert [facility] 07/25/2011, 10:03 AM [phone]

## 2011-07-25 NOTE — ED Provider Notes (Signed)
Medical screening examination/treatment/procedure(s) were performed by non-physician practitioner and as supervising physician I was immediately available for consultation/collaboration.  Anushri Casalino L Terral Cooks, MD 07/25/11 0102 

## 2011-07-25 NOTE — Op Note (Signed)
Carla [name], Carla [name]                ACCOUNT NO.:  [id number]  MEDICAL RECORD NO.:  [id number]  LOCATION:  1612                         FACILITY:  [facility]  PHYSICIAN:  Vania Rea. Arianni Gallego, M.D.  DATE OF BIRTH:  1950-12-03  DATE OF PROCEDURE:  07/24/2011 DATE OF DISCHARGE:                              OPERATIVE REPORT   PREOPERATIVE DIAGNOSIS:  Comminuted displaced left patella fracture.  POSTOPERATIVE DIAGNOSIS:  Comminuted displaced left patella fracture.  PROCEDURE:  Partial patellectomy and repair of the infrapatellar tendon, left knee.  SURGEON:  Vania Rea. Willi Borowiak, MD  ASSISTANT:  Lucita Lora. Shuford, PA-C.  ANESTHESIA:  General endotracheal as well as local.  TOURNIQUET TIME:  Available from the anesthesia record, less than 1 hour.  ESTIMATED BLOOD LOSS:  Minimal.  DRAINS:  None.  HISTORY:  Carla [name] is a 61 year old female who slipped and fell earlier today landing directly on the anterior aspect of the flexed left knee sustaining a immediate pain, swelling and inability to bear weight. She was brought to the [facility] Emergency Room, and she is found to have markedly swollen and painful left knee with palpable defect in the infrapatellar tendon and patella.  Plain radiographs were obtained showing a severely displaced transverse patella fracture at the junction between the inferior third and the proximal two-thirds with severe comminution of the distal segment.  She was brought to the operative room at this time for planned ORIF versus partial patellectomy and repair of the tendon.  I preoperatively counseled Carla [name] on treatment options as well as risks versus benefits thereof.  Possible surgical complications were reviewed including the potential for bleeding, infection, neurovascular injury, DVT, PE, as well as persistent pain.  She understands and accepts and agrees with our planned procedure.  PROCEDURE IN DETAIL:  After undergoing routine preop evaluation,  the patient received prophylactic antibiotics, and brought to the operating room and placed supine on the operating room table, underwent smooth induction of a general endotracheal anesthesia.  Tourniquet was applied to the left thigh.  Left leg was sterilely prepped and draped in standard fashion.  Time-out was called.  Leg was exsanguinated with the tourniquet inflated to 300 mmHg.  An anterior midline incision was then made approximately 15 cm in length centered over the patella.  Skin flaps were elevated.  Dissection carried deeply down to the fracture site.  The skin flaps were elevated, allowing exposure of the patella, the fracture fragment and the adjacent aspect of the quadriceps and patellar tendons.  Large hemarthrosis was evacuated from the knee joint. We did indeed find that the inferior pole of patella was severely comminuted.  It was not amenable to any type of internal fixation.  With this finding, we went ahead and excised the major bone fragments leaving the proximal end of the infrapatellar tendon.  We then passed parallel grasping sutures of #2 FiberWire utilizing "crack" technique.  We then placed 3 longitudinal drill holes through the patella equidistance across the width of the fractured surface of the proximal segment, and these were brought out proximally.  Through these, we then passed the pairs of sutures from the patellar tendon brought them out through the superior  pole of the patella and then the suture pairs were tied allowing excellent reapposition of the proximal aspect of the patellar tendon against the inferior pole of the patella.  The knee was then flexed taken all the laxity out of the system and these sutures were once again tightened and terminally tied.  Overall stability of the construct and reapposition of the tendon to the inferior surface of the patella was much to our satisfaction.  I then repaired the retinaculum both medially and laterally  with interrupted figure-of-eight #1 Vicryl sutures.  Wound was then irrigated.  A 2-0 Vicryl used for the subcu layer and intracuticular with 3-0 Monocryl for the skin followed by Steri-Strips.  Local 0.25% plain Marcaine was instilled along the incision.  Dry dressing applied.  Knee was then placed in full extension and a knee immobilizer.  The tourniquet was let down.  The patient was awakened, extubated, and taken to the recovery room in stable condition.     Vania Rea. Kelcey Korus, M.D.     KMS/MEDQ  D:  07/24/2011  T:  07/25/2011  Job:  [id number]

## 2011-07-25 NOTE — Op Note (Signed)
Carla [name], Carla [name]                ACCOUNT NO.:  [id number]  MEDICAL RECORD NO.:  [id number]  LOCATION:  1612                         FACILITY:  [facility]  PHYSICIAN:  Vania Rea. Charo Philipp, M.D.  DATE OF BIRTH:  May 20, 1950  DATE OF PROCEDURE:  07/24/2011 DATE OF DISCHARGE:                              OPERATIVE REPORT   ADDENDUM:  Ralene Bathe, PA-C was used as an Geophysicist/field seismologist for this case, essential for help with positioning of the extremity, retraction, manipulation of tissues, suture passage, wound closure, and intraoperative decision making as well as facilitating the case to be completed in an expeditious fashion.     Vania Rea. Malaka Ruffner, M.D.     KMS/MEDQ  D:  07/24/2011  T:  07/25/2011  Job:  [id number]

## 2011-07-25 NOTE — Progress Notes (Signed)
Utilization review completed.  

## 2011-07-25 NOTE — Discharge Summary (Signed)
  PATIENT ID:      Carla [name]  MRN:     [id number] DOB/AGE:    02/07/1950 / 62 y.o.     DISCHARGE SUMMARY  ADMISSION DATE:    07/24/2011 DISCHARGE DATE:   07/25/2011   ADMISSION DIAGNOSIS: Closed fracture of patella [822.0] FALL/KNEE INJURY  (fractured left patella)  DISCHARGE DIAGNOSIS:  fractured left patella    ADDITIONAL DIAGNOSIS: Active Problems:  Narcolepsy  Past Medical History  Diagnosis Date  . Narcolepsy   . Hypercholesteremia   . Arthritis     PROCEDURE: Procedure(s): PATELLECTOMY on 07/24/2011  CONSULTS:   none  HISTORY:  See H&P in chart  HOSPITAL COURSE:  Anis Cinelli is a 61 y.o. admitted on 07/24/2011 and found to have a diagnosis of fractured left patella.  After appropriate laboratory studies were obtained  they were taken to the operating room on 07/24/2011 and underwent Procedure(s): PATELLECTOMY.   They were given perioperative antibiotics:  Anti-infectives     Start     Dose/Rate Route Frequency Ordered Stop   07/25/11 0300   ceFAZolin (ANCEF) IVPB 2 g/50 mL premix        2 g 100 mL/hr over 30 Minutes Intravenous Every 6 hours 07/24/11 2343 07/25/11 2059        . Blood products given:none  Pt was comfortable and doing well on am following surgery. Her pain was controlled and dressing dry. The remainder of the hospital course was dedicated to ambulation and strengthening.   The patient was discharged on 1 Day Post-Op in  Good condition.

## 2011-08-06 NOTE — ED Provider Notes (Signed)
Medical screening examination/treatment/procedure(s) were performed by non-physician practitioner and as supervising physician I was immediately available for consultation/collaboration.  Flint Melter, MD 08/06/11 [phone]

## 2011-09-19 DIAGNOSIS — S82009A Unspecified fracture of unspecified patella, initial encounter for closed fracture: Secondary | ICD-10-CM | POA: Diagnosis not present

## 2011-09-30 DIAGNOSIS — S82009A Unspecified fracture of unspecified patella, initial encounter for closed fracture: Secondary | ICD-10-CM | POA: Diagnosis not present

## 2011-10-03 DIAGNOSIS — S82009A Unspecified fracture of unspecified patella, initial encounter for closed fracture: Secondary | ICD-10-CM | POA: Diagnosis not present

## 2011-10-07 DIAGNOSIS — S82009A Unspecified fracture of unspecified patella, initial encounter for closed fracture: Secondary | ICD-10-CM | POA: Diagnosis not present

## 2011-10-14 DIAGNOSIS — S82009A Unspecified fracture of unspecified patella, initial encounter for closed fracture: Secondary | ICD-10-CM | POA: Diagnosis not present

## 2011-10-17 DIAGNOSIS — S82009A Unspecified fracture of unspecified patella, initial encounter for closed fracture: Secondary | ICD-10-CM | POA: Diagnosis not present

## 2011-10-29 ENCOUNTER — Encounter (HOSPITAL_COMMUNITY): Payer: Self-pay

## 2011-10-31 DIAGNOSIS — S82009A Unspecified fracture of unspecified patella, initial encounter for closed fracture: Secondary | ICD-10-CM | POA: Diagnosis not present

## 2011-11-06 ENCOUNTER — Other Ambulatory Visit: Payer: Self-pay | Admitting: Internal Medicine

## 2011-11-06 DIAGNOSIS — R1310 Dysphagia, unspecified: Secondary | ICD-10-CM

## 2011-11-10 ENCOUNTER — Other Ambulatory Visit: Payer: Self-pay | Admitting: Internal Medicine

## 2011-11-10 DIAGNOSIS — E049 Nontoxic goiter, unspecified: Secondary | ICD-10-CM

## 2011-11-12 ENCOUNTER — Ambulatory Visit
Admission: RE | Admit: 2011-11-12 | Discharge: 2011-11-12 | Disposition: A | Discharge status: Home / Self Care | Payer: BC Managed Care – PPO | Source: Ambulatory Visit | Attending: Internal Medicine | Admitting: Internal Medicine

## 2011-11-12 ENCOUNTER — Other Ambulatory Visit: Payer: Self-pay | Admitting: Internal Medicine

## 2011-11-12 DIAGNOSIS — R1310 Dysphagia, unspecified: Secondary | ICD-10-CM

## 2011-11-19 ENCOUNTER — Ambulatory Visit
Admission: RE | Admit: 2011-11-19 | Discharge: 2011-11-19 | Disposition: A | Discharge status: Home / Self Care | Payer: BC Managed Care – PPO | Source: Ambulatory Visit | Attending: Internal Medicine | Admitting: Internal Medicine

## 2011-11-19 DIAGNOSIS — R1310 Dysphagia, unspecified: Secondary | ICD-10-CM

## 2011-11-19 DIAGNOSIS — E049 Nontoxic goiter, unspecified: Secondary | ICD-10-CM

## 2011-11-26 ENCOUNTER — Other Ambulatory Visit: Payer: Self-pay | Admitting: Internal Medicine

## 2011-11-26 DIAGNOSIS — E041 Nontoxic single thyroid nodule: Secondary | ICD-10-CM

## 2011-11-27 ENCOUNTER — Other Ambulatory Visit: Payer: Self-pay | Admitting: Internal Medicine

## 2011-11-27 DIAGNOSIS — E041 Nontoxic single thyroid nodule: Secondary | ICD-10-CM

## 2011-12-03 ENCOUNTER — Other Ambulatory Visit (HOSPITAL_COMMUNITY)
Admission: RE | Admit: 2011-12-03 | Discharge: 2011-12-03 | Disposition: A | Discharge status: Home / Self Care | Payer: Medicare Other | Source: Ambulatory Visit | Attending: Interventional Radiology | Admitting: Interventional Radiology

## 2011-12-03 ENCOUNTER — Ambulatory Visit
Admission: RE | Admit: 2011-12-03 | Discharge: 2011-12-03 | Disposition: A | Discharge status: Home / Self Care | Payer: BC Managed Care – PPO | Source: Ambulatory Visit | Attending: Internal Medicine | Admitting: Internal Medicine

## 2011-12-03 ENCOUNTER — Ambulatory Visit
Admission: RE | Admit: 2011-12-03 | Discharge: 2011-12-03 | Disposition: A | Discharge status: Home / Self Care | Payer: Medicare Other | Source: Ambulatory Visit | Attending: Internal Medicine | Admitting: Internal Medicine

## 2011-12-03 DIAGNOSIS — E041 Nontoxic single thyroid nodule: Secondary | ICD-10-CM | POA: Diagnosis not present

## 2011-12-03 DIAGNOSIS — E049 Nontoxic goiter, unspecified: Secondary | ICD-10-CM | POA: Diagnosis not present

## 2011-12-11 DIAGNOSIS — M25569 Pain in unspecified knee: Secondary | ICD-10-CM | POA: Diagnosis not present

## 2011-12-11 DIAGNOSIS — IMO0001 Reserved for inherently not codable concepts without codable children: Secondary | ICD-10-CM | POA: Diagnosis not present

## 2011-12-11 DIAGNOSIS — M2580 Other specified joint disorders, unspecified joint: Secondary | ICD-10-CM | POA: Diagnosis not present

## 2011-12-11 DIAGNOSIS — M159 Polyosteoarthritis, unspecified: Secondary | ICD-10-CM | POA: Diagnosis not present

## 2011-12-11 DIAGNOSIS — IMO0002 Reserved for concepts with insufficient information to code with codable children: Secondary | ICD-10-CM | POA: Diagnosis not present

## 2011-12-17 DIAGNOSIS — E039 Hypothyroidism, unspecified: Secondary | ICD-10-CM | POA: Diagnosis not present

## 2011-12-17 DIAGNOSIS — R1310 Dysphagia, unspecified: Secondary | ICD-10-CM | POA: Diagnosis not present

## 2011-12-17 DIAGNOSIS — R7301 Impaired fasting glucose: Secondary | ICD-10-CM | POA: Diagnosis not present

## 2011-12-17 DIAGNOSIS — E782 Mixed hyperlipidemia: Secondary | ICD-10-CM | POA: Diagnosis not present

## 2011-12-24 DIAGNOSIS — F39 Unspecified mood [affective] disorder: Secondary | ICD-10-CM | POA: Diagnosis not present

## 2011-12-24 DIAGNOSIS — E039 Hypothyroidism, unspecified: Secondary | ICD-10-CM | POA: Diagnosis not present

## 2011-12-24 DIAGNOSIS — R7301 Impaired fasting glucose: Secondary | ICD-10-CM | POA: Diagnosis not present

## 2011-12-24 DIAGNOSIS — E782 Mixed hyperlipidemia: Secondary | ICD-10-CM | POA: Diagnosis not present

## 2012-01-01 DIAGNOSIS — K219 Gastro-esophageal reflux disease without esophagitis: Secondary | ICD-10-CM | POA: Diagnosis not present

## 2012-01-01 DIAGNOSIS — R490 Dysphonia: Secondary | ICD-10-CM | POA: Diagnosis not present

## 2012-01-01 DIAGNOSIS — R1310 Dysphagia, unspecified: Secondary | ICD-10-CM | POA: Diagnosis not present

## 2012-01-09 ENCOUNTER — Other Ambulatory Visit: Payer: Self-pay | Admitting: Internal Medicine

## 2012-01-09 DIAGNOSIS — Z1231 Encounter for screening mammogram for malignant neoplasm of breast: Secondary | ICD-10-CM

## 2012-02-11 ENCOUNTER — Ambulatory Visit: Payer: Medicare Other

## 2012-02-13 ENCOUNTER — Ambulatory Visit
Admission: RE | Admit: 2012-02-13 | Discharge: 2012-02-13 | Disposition: A | Discharge status: Home / Self Care | Payer: Medicare Other | Source: Ambulatory Visit | Attending: Internal Medicine | Admitting: Internal Medicine

## 2012-02-13 DIAGNOSIS — Z1231 Encounter for screening mammogram for malignant neoplasm of breast: Secondary | ICD-10-CM | POA: Diagnosis not present

## 2012-03-04 DIAGNOSIS — E049 Nontoxic goiter, unspecified: Secondary | ICD-10-CM | POA: Diagnosis not present

## 2012-03-04 DIAGNOSIS — F339 Major depressive disorder, recurrent, unspecified: Secondary | ICD-10-CM | POA: Diagnosis not present

## 2012-03-04 DIAGNOSIS — N951 Menopausal and female climacteric states: Secondary | ICD-10-CM | POA: Diagnosis not present

## 2012-03-04 DIAGNOSIS — R7309 Other abnormal glucose: Secondary | ICD-10-CM | POA: Diagnosis not present

## 2012-03-04 DIAGNOSIS — E785 Hyperlipidemia, unspecified: Secondary | ICD-10-CM | POA: Diagnosis not present

## 2012-03-15 DIAGNOSIS — G47419 Narcolepsy without cataplexy: Secondary | ICD-10-CM | POA: Diagnosis not present

## 2012-03-15 DIAGNOSIS — G4733 Obstructive sleep apnea (adult) (pediatric): Secondary | ICD-10-CM | POA: Diagnosis not present

## 2012-03-15 DIAGNOSIS — G4710 Hypersomnia, unspecified: Secondary | ICD-10-CM | POA: Diagnosis not present

## 2012-03-16 DIAGNOSIS — M159 Polyosteoarthritis, unspecified: Secondary | ICD-10-CM | POA: Diagnosis not present

## 2012-03-16 DIAGNOSIS — M659 Synovitis and tenosynovitis, unspecified: Secondary | ICD-10-CM | POA: Diagnosis not present

## 2012-03-16 DIAGNOSIS — M25569 Pain in unspecified knee: Secondary | ICD-10-CM | POA: Diagnosis not present

## 2012-03-16 DIAGNOSIS — M064 Inflammatory polyarthropathy: Secondary | ICD-10-CM | POA: Diagnosis not present

## 2012-04-09 DIAGNOSIS — E785 Hyperlipidemia, unspecified: Secondary | ICD-10-CM | POA: Diagnosis not present

## 2012-04-09 DIAGNOSIS — Z6835 Body mass index (BMI) 35.0-35.9, adult: Secondary | ICD-10-CM | POA: Diagnosis not present

## 2012-04-09 DIAGNOSIS — G47419 Narcolepsy without cataplexy: Secondary | ICD-10-CM | POA: Diagnosis not present

## 2012-04-09 DIAGNOSIS — M1990 Unspecified osteoarthritis, unspecified site: Secondary | ICD-10-CM | POA: Diagnosis not present

## 2012-04-09 DIAGNOSIS — R748 Abnormal levels of other serum enzymes: Secondary | ICD-10-CM | POA: Diagnosis not present

## 2012-04-09 DIAGNOSIS — F339 Major depressive disorder, recurrent, unspecified: Secondary | ICD-10-CM | POA: Diagnosis not present

## 2012-04-09 DIAGNOSIS — D699 Hemorrhagic condition, unspecified: Secondary | ICD-10-CM | POA: Diagnosis not present

## 2012-04-09 DIAGNOSIS — M069 Rheumatoid arthritis, unspecified: Secondary | ICD-10-CM | POA: Diagnosis not present

## 2012-05-26 DIAGNOSIS — R030 Elevated blood-pressure reading, without diagnosis of hypertension: Secondary | ICD-10-CM | POA: Diagnosis not present

## 2012-09-15 DIAGNOSIS — G4733 Obstructive sleep apnea (adult) (pediatric): Secondary | ICD-10-CM | POA: Diagnosis not present

## 2012-09-15 DIAGNOSIS — G47419 Narcolepsy without cataplexy: Secondary | ICD-10-CM | POA: Diagnosis not present

## 2012-09-15 DIAGNOSIS — G4710 Hypersomnia, unspecified: Secondary | ICD-10-CM | POA: Diagnosis not present

## 2012-10-15 DIAGNOSIS — E785 Hyperlipidemia, unspecified: Secondary | ICD-10-CM | POA: Diagnosis not present

## 2012-10-15 DIAGNOSIS — E041 Nontoxic single thyroid nodule: Secondary | ICD-10-CM | POA: Diagnosis not present

## 2012-10-15 DIAGNOSIS — Z6832 Body mass index (BMI) 32.0-32.9, adult: Secondary | ICD-10-CM | POA: Diagnosis not present

## 2012-10-15 DIAGNOSIS — Z79899 Other long term (current) drug therapy: Secondary | ICD-10-CM | POA: Diagnosis not present

## 2012-10-15 DIAGNOSIS — M810 Age-related osteoporosis without current pathological fracture: Secondary | ICD-10-CM | POA: Diagnosis not present

## 2012-10-15 DIAGNOSIS — Z0000 Encounter for general adult medical examination without abnormal findings: Secondary | ICD-10-CM | POA: Diagnosis not present

## 2012-11-22 DIAGNOSIS — Z1211 Encounter for screening for malignant neoplasm of colon: Secondary | ICD-10-CM | POA: Diagnosis not present

## 2012-11-26 DIAGNOSIS — M159 Polyosteoarthritis, unspecified: Secondary | ICD-10-CM | POA: Diagnosis not present

## 2012-11-26 DIAGNOSIS — M899 Disorder of bone, unspecified: Secondary | ICD-10-CM | POA: Diagnosis not present

## 2012-11-26 DIAGNOSIS — M659 Synovitis and tenosynovitis, unspecified: Secondary | ICD-10-CM | POA: Diagnosis not present

## 2012-11-26 DIAGNOSIS — M25569 Pain in unspecified knee: Secondary | ICD-10-CM | POA: Diagnosis not present

## 2012-11-26 DIAGNOSIS — M064 Inflammatory polyarthropathy: Secondary | ICD-10-CM | POA: Diagnosis not present

## 2012-12-17 DIAGNOSIS — M25569 Pain in unspecified knee: Secondary | ICD-10-CM | POA: Diagnosis not present

## 2012-12-28 DIAGNOSIS — M25569 Pain in unspecified knee: Secondary | ICD-10-CM | POA: Diagnosis not present

## 2013-01-14 DIAGNOSIS — M25569 Pain in unspecified knee: Secondary | ICD-10-CM | POA: Diagnosis not present

## 2013-01-14 DIAGNOSIS — M899 Disorder of bone, unspecified: Secondary | ICD-10-CM | POA: Diagnosis not present

## 2013-01-14 DIAGNOSIS — M949 Disorder of cartilage, unspecified: Secondary | ICD-10-CM | POA: Diagnosis not present

## 2013-01-14 DIAGNOSIS — D169 Benign neoplasm of bone and articular cartilage, unspecified: Secondary | ICD-10-CM | POA: Diagnosis not present

## 2013-01-14 DIAGNOSIS — M2240 Chondromalacia patellae, unspecified knee: Secondary | ICD-10-CM | POA: Diagnosis not present

## 2013-02-11 DIAGNOSIS — M2240 Chondromalacia patellae, unspecified knee: Secondary | ICD-10-CM | POA: Diagnosis not present

## 2013-03-14 DIAGNOSIS — M2390 Unspecified internal derangement of unspecified knee: Secondary | ICD-10-CM | POA: Diagnosis not present

## 2013-03-14 DIAGNOSIS — M25569 Pain in unspecified knee: Secondary | ICD-10-CM | POA: Diagnosis not present

## 2013-04-18 DIAGNOSIS — M2040 Other hammer toe(s) (acquired), unspecified foot: Secondary | ICD-10-CM | POA: Diagnosis not present

## 2013-04-18 DIAGNOSIS — Z5189 Encounter for other specified aftercare: Secondary | ICD-10-CM | POA: Diagnosis not present

## 2013-04-29 DIAGNOSIS — E049 Nontoxic goiter, unspecified: Secondary | ICD-10-CM | POA: Diagnosis not present

## 2013-04-29 DIAGNOSIS — E785 Hyperlipidemia, unspecified: Secondary | ICD-10-CM | POA: Diagnosis not present

## 2013-04-29 DIAGNOSIS — Z79899 Other long term (current) drug therapy: Secondary | ICD-10-CM | POA: Diagnosis not present

## 2013-04-29 DIAGNOSIS — Z6830 Body mass index (BMI) 30.0-30.9, adult: Secondary | ICD-10-CM | POA: Diagnosis not present

## 2013-04-29 DIAGNOSIS — R7309 Other abnormal glucose: Secondary | ICD-10-CM | POA: Diagnosis not present

## 2013-06-07 DIAGNOSIS — M79609 Pain in unspecified limb: Secondary | ICD-10-CM | POA: Diagnosis not present

## 2013-06-08 ENCOUNTER — Other Ambulatory Visit: Payer: Self-pay

## 2013-06-08 DIAGNOSIS — Z1231 Encounter for screening mammogram for malignant neoplasm of breast: Secondary | ICD-10-CM

## 2013-06-17 ENCOUNTER — Ambulatory Visit
Admission: RE | Admit: 2013-06-17 | Discharge: 2013-06-17 | Disposition: A | Discharge status: Home / Self Care | Payer: Medicare Other | Source: Ambulatory Visit

## 2013-06-17 ENCOUNTER — Encounter (INDEPENDENT_AMBULATORY_CARE_PROVIDER_SITE_OTHER): Payer: Self-pay

## 2013-06-17 DIAGNOSIS — Z1231 Encounter for screening mammogram for malignant neoplasm of breast: Secondary | ICD-10-CM | POA: Diagnosis not present

## 2013-06-22 DIAGNOSIS — M25569 Pain in unspecified knee: Secondary | ICD-10-CM | POA: Diagnosis not present

## 2013-06-22 DIAGNOSIS — M25819 Other specified joint disorders, unspecified shoulder: Secondary | ICD-10-CM | POA: Diagnosis not present

## 2013-07-14 DIAGNOSIS — M25819 Other specified joint disorders, unspecified shoulder: Secondary | ICD-10-CM | POA: Diagnosis not present

## 2013-07-14 DIAGNOSIS — M25569 Pain in unspecified knee: Secondary | ICD-10-CM | POA: Diagnosis not present

## 2013-07-27 DIAGNOSIS — M25819 Other specified joint disorders, unspecified shoulder: Secondary | ICD-10-CM | POA: Diagnosis not present

## 2013-08-03 DIAGNOSIS — M1710 Unilateral primary osteoarthritis, unspecified knee: Secondary | ICD-10-CM | POA: Diagnosis not present

## 2013-08-03 DIAGNOSIS — M25569 Pain in unspecified knee: Secondary | ICD-10-CM | POA: Diagnosis not present

## 2013-08-03 DIAGNOSIS — M25819 Other specified joint disorders, unspecified shoulder: Secondary | ICD-10-CM | POA: Diagnosis not present

## 2013-08-12 DIAGNOSIS — M1710 Unilateral primary osteoarthritis, unspecified knee: Secondary | ICD-10-CM | POA: Diagnosis not present

## 2013-08-19 DIAGNOSIS — G47419 Narcolepsy without cataplexy: Secondary | ICD-10-CM | POA: Diagnosis not present

## 2013-09-23 DIAGNOSIS — M1710 Unilateral primary osteoarthritis, unspecified knee: Secondary | ICD-10-CM | POA: Diagnosis not present

## 2013-09-30 DIAGNOSIS — S43499A Other sprain of unspecified shoulder joint, initial encounter: Secondary | ICD-10-CM | POA: Diagnosis not present

## 2013-09-30 DIAGNOSIS — M19019 Primary osteoarthritis, unspecified shoulder: Secondary | ICD-10-CM | POA: Diagnosis not present

## 2013-09-30 DIAGNOSIS — G8918 Other acute postprocedural pain: Secondary | ICD-10-CM | POA: Diagnosis not present

## 2013-09-30 DIAGNOSIS — M67919 Unspecified disorder of synovium and tendon, unspecified shoulder: Secondary | ICD-10-CM | POA: Diagnosis not present

## 2013-09-30 DIAGNOSIS — M719 Bursopathy, unspecified: Secondary | ICD-10-CM | POA: Diagnosis not present

## 2013-09-30 DIAGNOSIS — M25819 Other specified joint disorders, unspecified shoulder: Secondary | ICD-10-CM | POA: Diagnosis not present

## 2013-09-30 DIAGNOSIS — M24119 Other articular cartilage disorders, unspecified shoulder: Secondary | ICD-10-CM | POA: Diagnosis not present

## 2013-09-30 HISTORY — PX: SHOULDER SURGERY: SHX246

## 2013-10-10 DIAGNOSIS — M25512 Pain in left shoulder: Secondary | ICD-10-CM | POA: Diagnosis not present

## 2013-10-10 DIAGNOSIS — Z9889 Other specified postprocedural states: Secondary | ICD-10-CM | POA: Diagnosis not present

## 2013-10-10 DIAGNOSIS — Z4789 Encounter for other orthopedic aftercare: Secondary | ICD-10-CM | POA: Diagnosis not present

## 2013-10-17 DIAGNOSIS — M25512 Pain in left shoulder: Secondary | ICD-10-CM | POA: Diagnosis not present

## 2013-10-20 DIAGNOSIS — M25512 Pain in left shoulder: Secondary | ICD-10-CM | POA: Diagnosis not present

## 2013-11-03 DIAGNOSIS — M25512 Pain in left shoulder: Secondary | ICD-10-CM | POA: Diagnosis not present

## 2013-11-04 DIAGNOSIS — F3340 Major depressive disorder, recurrent, in remission, unspecified: Secondary | ICD-10-CM | POA: Diagnosis not present

## 2013-11-04 DIAGNOSIS — M069 Rheumatoid arthritis, unspecified: Secondary | ICD-10-CM | POA: Diagnosis not present

## 2013-11-04 DIAGNOSIS — E785 Hyperlipidemia, unspecified: Secondary | ICD-10-CM | POA: Diagnosis not present

## 2013-11-04 DIAGNOSIS — E049 Nontoxic goiter, unspecified: Secondary | ICD-10-CM | POA: Diagnosis not present

## 2013-11-04 DIAGNOSIS — Z79899 Other long term (current) drug therapy: Secondary | ICD-10-CM | POA: Diagnosis not present

## 2013-11-04 DIAGNOSIS — R739 Hyperglycemia, unspecified: Secondary | ICD-10-CM | POA: Diagnosis not present

## 2013-11-04 DIAGNOSIS — Z0000 Encounter for general adult medical examination without abnormal findings: Secondary | ICD-10-CM | POA: Diagnosis not present

## 2013-11-04 DIAGNOSIS — E559 Vitamin D deficiency, unspecified: Secondary | ICD-10-CM | POA: Diagnosis not present

## 2013-11-14 DIAGNOSIS — M25512 Pain in left shoulder: Secondary | ICD-10-CM | POA: Diagnosis not present

## 2013-11-21 DIAGNOSIS — M25512 Pain in left shoulder: Secondary | ICD-10-CM | POA: Diagnosis not present

## 2013-11-25 NOTE — Progress Notes (Signed)
Please put orders in Epic surgery 12-12-13 pre op 11-30-13 Thanks 

## 2013-11-28 ENCOUNTER — Ambulatory Visit: Payer: Self-pay | Admitting: Orthopedic Surgery

## 2013-11-28 NOTE — Progress Notes (Signed)
Preoperative surgical orders have been place into the Epic hospital system for Carla [name] on 11/28/2013, 7:52 AM  by Mickel Crow for surgery on 12-12-2013.  Preop Total Knee orders including Experal, IV Tylenol, and IV Decadron as long as there are no contraindications to the above medications. [name], PA-C

## 2013-11-29 ENCOUNTER — Other Ambulatory Visit (HOSPITAL_COMMUNITY): Payer: Self-pay | Admitting: *Deleted

## 2013-11-29 NOTE — Patient Instructions (Addendum)
Carla [name]  11/29/2013                           YOUR PROCEDURE IS SCHEDULED ON:  12/12/13                ENTER FROM [address] - GO TO PARKING DECK               LOOK FOR VALET [facility]ARKING  / [employer]                              FOLLOW  SIGNS TO SHORT STAY CENTER                 ARRIVE AT SHORT STAY AT: 6:00 AM               CALL THIS NUMBER IF ANY PROBLEMS THE DAY OF SURGERY :               [phone]                                REMEMBER:   Do not eat food or drink liquids AFTER MIDNIGHT                  Take these medicines the morning of surgery with               A SIPS OF WATER :   NONE      Do not wear jewelry, make-up   Do not wear lotions, powders, or perfumes.   Do not shave legs or underarms 12 hrs. before surgery (men may shave face)  Do not bring valuables to the hospital.  Contacts, dentures or bridgework may not be worn into surgery.  Leave suitcase in the car. After surgery it may be brought to your room.  For patients admitted to the hospital more than one night, checkout time is            11:00 AM                                                        ________________________________________________________________________                                                                                                  [place]  Before surgery, you can play an important role.  Because skin is not sterile, your skin needs to be as free of germs as possible.  You can reduce the number of germs on your skin by washing with CHG (chlorahexidine gluconate) soap before surgery.  CHG is an antiseptic cleaner which kills germs and bonds with the skin to continue killing germs even after washing. Please DO NOT use if you  have an allergy to CHG or antibacterial soaps.  If your skin becomes reddened/irritated stop using the CHG and inform your nurse when you arrive at Short Stay. Do not shave (including legs and underarms)  for at least 48 hours prior to the first CHG shower.  You may shave your face. Please follow these instructions carefully:   1.  Shower with CHG Soap the night before surgery and the  morning of Surgery.   2.  If you choose to wash your hair, wash your hair first as usual with your  normal  Shampoo.   3.  After you shampoo, rinse your hair and body thoroughly to remove the  shampoo.                                         4.  Use CHG as you would any other liquid soap.  You can apply chg directly  to the skin and wash . Gently wash with scrungie or clean wascloth    5.  Apply the CHG Soap to your body ONLY FROM THE NECK DOWN.   Do not use on open                           Wound or open sores. Avoid contact with eyes, ears mouth and genitals (private parts).                        Genitals (private parts) with your normal soap.              6.  Wash thoroughly, paying special attention to the area where your surgery  will be performed.   7.  Thoroughly rinse your body with warm water from the neck down.   8.  DO NOT shower/wash with your normal soap after using and rinsing off  the CHG Soap .                9.  Pat yourself dry with a clean towel.             10.  Wear clean pajamas.             11.  Place clean [name] on your bed the night of your first shower and do not  sleep with pets.  Day of Surgery : Do not apply any lotions/deodorants the morning of surgery.  Please wear clean clothes to the hospital/surgery center.  FAILURE TO FOLLOW THESE INSTRUCTIONS MAY RESULT IN THE CANCELLATION OF YOUR SURGERY    PATIENT SIGNATURE_________________________________  ______________________________________________________________________     [name]  An incentive spirometer is a tool that can help keep your lungs clear and active. This tool measures how well you are filling your lungs with each breath. Taking long deep breaths may help reverse or decrease the chance  of developing breathing (pulmonary) problems (especially infection) following:  A long period of time when you are unable to move or be active. BEFORE THE PROCEDURE   If the spirometer includes an indicator to show your best effort, your nurse or respiratory therapist will set it to a desired goal.  If possible, sit up straight or lean slightly forward. Try not to slouch.  Hold the incentive spirometer in an upright position. INSTRUCTIONS FOR USE   Sit on the edge of your bed if  possible, or sit up as far as you can in bed or on a chair.  Hold the incentive spirometer in an upright position.  Breathe out normally.  Place the mouthpiece in your mouth and seal your lips tightly around it.  Breathe in slowly and as deeply as possible, raising the piston or the ball toward the top of the column.  Hold your breath for 3-5 seconds or for as long as possible. Allow the piston or ball to fall to the bottom of the column.  Remove the mouthpiece from your mouth and breathe out normally.  Rest for a few seconds and repeat Steps 1 through 7 at least 10 times every 1-2 hours when you are awake. Take your time and take a few normal breaths between deep breaths.  The spirometer may include an indicator to show your best effort. Use the indicator as a goal to work toward during each repetition.  After each set of 10 deep breaths, practice coughing to be sure your lungs are clear. If you have an incision (the cut made at the time of surgery), support your incision when coughing by placing a pillow or rolled up towels firmly against it. Once you are able to get out of bed, walk around indoors and cough well. You may stop using the incentive spirometer when instructed by your caregiver.  RISKS AND COMPLICATIONS  Take your time so you do not get dizzy or light-headed.  If you are in pain, you may need to take or ask for pain medication before doing incentive spirometry. It is harder to take a deep  breath if you are having pain. AFTER USE  Rest and breathe slowly and easily.  It can be helpful to keep track of a log of your progress. Your caregiver can provide you with a simple table to help with this. If you are using the spirometer at home, follow these instructions: [place] IF:   You are having difficultly using the spirometer.  You have trouble using the spirometer as often as instructed.  Your pain medication is not giving enough relief while using the spirometer.  You develop fever of 100.5 F (38.1 C) or higher. SEEK IMMEDIATE MEDICAL CARE IF:   You cough up bloody sputum that had not been present before.  You develop fever of 102 F (38.9 C) or greater.  You develop worsening pain at or near the incision site. MAKE SURE YOU:   Understand these instructions.  Will watch your condition.  Will get help right away if you are not doing well or get worse. Document Released: 05/05/2006 Document Revised: 03/17/2011 Document Reviewed: 07/06/2006 ExitCare Patient Information 2014 Exit[employer], [place].   ________________________________________________________________________  WHAT IS A BLOOD TRANSFUSION? Blood Transfusion Information  A transfusion is the replacement of blood or some of its parts. Blood is made up of multiple cells which provide different functions.  Red blood cells carry oxygen and are used for blood loss replacement.  White blood cells fight against infection.  Platelets control bleeding.  Plasma helps clot blood.  Other blood products are available for specialized needs, such as hemophilia or other clotting disorders. BEFORE THE TRANSFUSION  Who gives blood for transfusions?   Healthy volunteers who are fully evaluated to make sure their blood is safe. This is blood bank blood. Transfusion therapy is the safest it has ever been in the practice of medicine. Before blood is taken from a donor, a complete history is taken to make sure  that person has no history of diseases nor engages in risky social behavior (examples are intravenous drug use or sexual activity with multiple partners). The donor's travel history is screened to minimize risk of transmitting infections, such as malaria. The donated blood is tested for signs of infectious diseases, such as HIV and hepatitis. The blood is then tested to be sure it is compatible with you in order to minimize the chance of a transfusion reaction. If you or a relative donates blood, this is often done in anticipation of surgery and is not appropriate for emergency situations. It takes many days to process the donated blood. RISKS AND COMPLICATIONS Although transfusion therapy is very safe and saves many lives, the main dangers of transfusion include:   Getting an infectious disease.  Developing a transfusion reaction. This is an allergic reaction to something in the blood you were given. Every precaution is taken to prevent this. The decision to have a blood transfusion has been considered carefully by your caregiver before blood is given. Blood is not given unless the benefits outweigh the risks. AFTER THE TRANSFUSION  Right after receiving a blood transfusion, you will usually feel much better and more energetic. This is especially true if your red blood cells have gotten low (anemic). The transfusion raises the level of the red blood cells which carry oxygen, and this usually causes an energy increase.  The nurse administering the transfusion will monitor you carefully for complications. HOME CARE INSTRUCTIONS  No special instructions are needed after a transfusion. You may find your energy is better. Speak with your caregiver about any limitations on activity for underlying diseases you may have. SEEK MEDICAL CARE IF:   Your condition is not improving after your transfusion.  You develop redness or irritation at the intravenous (IV) site. SEEK IMMEDIATE MEDICAL CARE IF:  Any of  the following symptoms occur over the next 12 hours:  Shaking chills.  You have a temperature by mouth above 102 F (38.9 C), not controlled by medicine.  Chest, back, or muscle pain.  People around you feel you are not acting correctly or are confused.  Shortness of breath or difficulty breathing.  Dizziness and fainting.  You get a rash or develop hives.  You have a decrease in urine output.  Your urine turns a dark color or changes to pink, red, or brown. Any of the following symptoms occur over the next 10 days:  You have a temperature by mouth above 102 F (38.9 C), not controlled by medicine.  Shortness of breath.  Weakness after normal activity.  The white part of the eye turns yellow (jaundice).  You have a decrease in the amount of urine or are urinating less often.  Your urine turns a dark color or changes to pink, red, or brown. Document Released: 12/21/1999 Document Revised: 03/17/2011 Document Reviewed: 08/09/2007 [facility] Patient Information 2014 [place], [place].  _______________________________________________________________________

## 2013-11-30 ENCOUNTER — Encounter (HOSPITAL_COMMUNITY)
Admission: RE | Admit: 2013-11-30 | Discharge: 2013-11-30 | Disposition: A | Discharge status: Home / Self Care | Payer: Medicare Other | Source: Ambulatory Visit | Attending: Orthopedic Surgery | Admitting: Orthopedic Surgery

## 2013-11-30 ENCOUNTER — Encounter (HOSPITAL_COMMUNITY): Payer: Self-pay

## 2013-11-30 DIAGNOSIS — Z01812 Encounter for preprocedural laboratory examination: Secondary | ICD-10-CM | POA: Insufficient documentation

## 2013-11-30 HISTORY — DX: Other specified disorders of bone density and structure, unspecified site: M85.80

## 2013-11-30 HISTORY — DX: Gastro-esophageal reflux disease without esophagitis: K21.9

## 2013-11-30 HISTORY — DX: Reserved for inherently not codable concepts without codable children: IMO0001

## 2013-11-30 HISTORY — DX: Major depressive disorder, single episode, unspecified: F32.9

## 2013-11-30 HISTORY — DX: Nontoxic goiter, unspecified: E04.9

## 2013-11-30 HISTORY — DX: Depression, unspecified: F32.A

## 2013-11-30 HISTORY — DX: Anesthesia of skin: R20.0

## 2013-11-30 HISTORY — DX: Sleep apnea, unspecified: G47.30

## 2013-11-30 HISTORY — DX: Personal history of other malignant neoplasm of skin: Z85.828

## 2013-11-30 LAB — URINALYSIS, ROUTINE W REFLEX MICROSCOPIC
Bilirubin Urine: NEGATIVE
Glucose, UA: NEGATIVE mg/dL
Hgb urine dipstick: NEGATIVE
Ketones, ur: NEGATIVE mg/dL
Nitrite: NEGATIVE
Protein, ur: NEGATIVE mg/dL
Specific Gravity, Urine: 1.028 (ref 1.005–1.030)
Urobilinogen, UA: 0.2 mg/dL (ref 0.0–1.0)
pH: 5 (ref 5.0–8.0)

## 2013-11-30 LAB — COMPREHENSIVE METABOLIC PANEL
ALT: 17 U/L (ref 0–35)
ANION GAP: 14 (ref 5–15)
AST: 21 U/L (ref 0–37)
Albumin: 4 g/dL (ref 3.5–5.2)
Alkaline Phosphatase: 100 U/L (ref 39–117)
BUN: 16 mg/dL (ref 6–23)
CO2: 26 mEq/L (ref 19–32)
CREATININE: 0.76 mg/dL (ref 0.50–1.10)
Calcium: 10 mg/dL (ref 8.4–10.5)
Chloride: 102 mEq/L (ref 96–112)
GFR calc Af Amer: >90 mL/min (ref >90)
GFR calc non Af Amer: 89 mL/min — ABNORMAL LOW (ref >90)
Glucose, Bld: 84 mg/dL (ref 70–99)
Potassium: 4.2 mEq/L (ref 3.7–5.3)
Sodium: 142 mEq/L (ref 137–147)
Total Bilirubin: 0.4 mg/dL (ref 0.3–1.2)
Total Protein: 7.4 g/dL (ref 6.0–8.3)

## 2013-11-30 LAB — CBC
HEMATOCRIT: 41.8 % (ref 36.0–46.0)
Hemoglobin: 13.6 g/dL (ref 12.0–15.0)
MCH: 28.5 pg (ref 26.0–34.0)
MCHC: 32.5 g/dL (ref 30.0–36.0)
MCV: 87.6 fL (ref 78.0–100.0)
Platelets: 356 10*3/uL (ref 150–400)
RBC: 4.77 MIL/uL (ref 3.87–5.11)
RDW: 12.7 % (ref 11.5–15.5)
WBC: 6.2 10*3/uL (ref 4.0–10.5)

## 2013-11-30 LAB — SURGICAL PCR SCREEN
MRSA, PCR: NEGATIVE
Staphylococcus aureus: POSITIVE — AB

## 2013-11-30 LAB — URINE MICROSCOPIC-ADD ON

## 2013-11-30 LAB — APTT: aPTT: 29 seconds (ref 24–37)

## 2013-11-30 LAB — PROTIME-INR
INR: 0.99 (ref 0.00–1.49)
Prothrombin Time: 13.2 seconds (ref 11.6–15.2)

## 2013-11-30 NOTE — Progress Notes (Signed)
U/A with micro results done 11/30/2013 faxed via EPIC to Dr Wynelle Link.

## 2013-12-05 NOTE — Progress Notes (Addendum)
12-05-13 1045 AM Positive Staph aureus PCR screen- Rx- Mupirocin called to pt pharmacy [facility], [address] [place]  [phone].

## 2013-12-05 NOTE — Pre-Procedure Instructions (Addendum)
12-05-13 1045 AM. Left message to inform pt PCR positive for Staph aureus will need to use Mupirocin called in to pharmacy as directed. 12-05-13 1440 Pt. Return call and confirmed received message to pick up Mupirocin ointment from pharmacy.

## 2013-12-10 NOTE — Anesthesia Preprocedure Evaluation (Addendum)
Anesthesia Evaluation  Patient identified by MRN, date of birth, ID band Patient awake    Reviewed: Allergy & Precautions, H&P , NPO status , Patient's Chart, lab work & pertinent test results  History of Anesthesia Complications Negative for: history of anesthetic complications  Airway Mallampati: II  TM Distance: >3 FB Neck ROM: Full    Dental no notable dental hx. (+) Dental Advisory Given, Poor Dentition,    Pulmonary sleep apnea , former smoker,  breath sounds clear to auscultation  Pulmonary exam normal       Cardiovascular Exercise Tolerance: Good negative cardio ROS  Rhythm:Regular Rate:Normal     Neuro/Psych PSYCHIATRIC DISORDERS Anxiety Depression negative neurological ROS     GI/Hepatic Neg liver ROS, GERD-  Medicated and Controlled,  Endo/Other  Obesity Goiter but reports being seen by a physician and no intervention needed at this time   Renal/GU negative Renal ROS  negative genitourinary   Musculoskeletal  (+) Arthritis -, Osteoarthritis,    Abdominal (+) + obese,   Peds negative pediatric ROS (+)  Hematology negative hematology ROS (+)   Anesthesia Other Findings   Reproductive/Obstetrics negative OB ROS                           Anesthesia Physical Anesthesia Plan  ASA: II  Anesthesia Plan: Spinal   Post-op Pain Management:    Induction: Intravenous  Airway Management Planned: Nasal Cannula  Additional Equipment:   Intra-op Plan:   Post-operative Plan: Extubation in OR  Informed Consent: I have reviewed the patients History and Physical, chart, labs and discussed the procedure including the risks, benefits and alternatives for the proposed anesthesia with the patient or authorized representative who has indicated his/her understanding and acceptance.   Dental advisory given  Plan Discussed with: CRNA  Anesthesia Plan Comments:         Anesthesia  Quick Evaluation

## 2013-12-11 ENCOUNTER — Ambulatory Visit: Payer: Self-pay | Admitting: Orthopedic Surgery

## 2013-12-11 ENCOUNTER — Encounter: Payer: Self-pay | Admitting: Orthopedic Surgery

## 2013-12-11 NOTE — H&P (Signed)
TOTAL KNEE ADMISSION H&P  Patient is being admitted for right total knee arthroplasty.  Subjective:  Chief Complaint:right knee pain.  HPI: Carla [name], 63 y.o. female, has a history of pain and functional disability in the right knee due to arthritis and has failed non-surgical conservative treatments for greater than 12 weeks to includeNSAID's and/or analgesics and corticosteriod injections.  Onset of symptoms was gradual, starting a couple of  years ago with gradually worsening course since that time. The patient noted prior procedures on the knee to include  arthroscopy on the right knee(s).  Patient currently rates pain in the right knee(s) at moderate activity. Patient has night pain, worsening of pain with activity and weight bearing and pain that interferes with activities of daily living.  Patient has evidence of joint space narrowing by imaging studies. This patient has had previous cortisone and viscosupplementation injections. There is no active infection.  Patient Active Problem List   Diagnosis Date Noted  . 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 Narcolepsy   . Hypercholesteremia   . Arthritis   . Shortness of breath dyspnea     "when flat on my back"  . Numbness     hands  . Osteopenia   . History of skin cancer   . GERD (gastroesophageal reflux disease)   . Goiter   . Sleep apnea     "very mild" pt states does not need c pap - last sleep study several yrs ago in [place]  . Depression     Past Surgical History  Procedure Laterality Date  . Abdominal hysterectomy    . Patellectomy  07/24/2011    Procedure: PATELLECTOMY;  Surgeon: Marin Shutter, MD;  Location: [facility];  Service: Orthopedics;  Laterality: Left;  PARTIAL PATTELECTOMY  . Shoulder surgery  09/30/13    left  . Fracture surgery      l knee for fx patella  . Knee arthroscopy    . Ankle fracture surgery  2010    bilateral     (Not in a hospital admission) Allergies   Allergen Reactions  . Codeine Other (See Comments)    For long periods of time--itching.     History  Substance Use Topics  . Smoking status: Former Smoker    Quit date: 12/01/2003  . Smokeless tobacco: Not on file  . Alcohol Use: Yes     Comment: rare    No family history on file.   Review of Systems  Constitutional: Positive for malaise/fatigue.  Respiratory: Negative.   Cardiovascular: Positive for orthopnea.  Gastrointestinal: Negative.   Genitourinary: Negative.   Musculoskeletal: Positive for back pain, joint pain and neck pain.  Skin: Negative.   Neurological: Negative.     Objective:  Physical Exam  Constitutional: She is oriented to person, place, and time. She appears well-developed and well-nourished. No distress.  HENT:  Head: Normocephalic and atraumatic.  Eyes: EOM are normal. Pupils are equal, round, and reactive to light.  Neck: Neck supple.  Cardiovascular: Normal rate, regular rhythm and normal heart sounds.   No murmur heard. Respiratory: Breath sounds normal.  GI: Soft. Bowel sounds are normal.  Musculoskeletal:       Right knee: She exhibits decreased range of motion (5-130). She exhibits no effusion, no LCL laxity and no MCL laxity. Tenderness found. Medial joint line tenderness noted.  Neurological: She is alert and oriented to person, place, and time.    Labs:   Estimated body  mass index is 30.22 kg/(m^2) as calculated from the following:   Height as of 11/30/13: 5\' 7"  (1.702 m).   Weight as of 11/30/13: 87.544 kg (193 lb).   Imaging Review Plain radiographs demonstrate moderate degenerative joint disease of the right knee(s).  The bone quality appears to be good for age and reported activity level.  Assessment/Plan:  End stage arthritis, right knee   The patient history, physical examination, clinical judgment of the provider and imaging studies are consistent with end stage degenerative joint disease of the right knee(s) and total  knee arthroplasty is deemed medically necessary. The treatment options including medical management, injection therapy arthroscopy and arthroplasty were discussed at length. The risks and benefits of total knee arthroplasty were presented and reviewed. The risks due to aseptic loosening, infection, stiffness, patella tracking problems, thromboembolic complications and other imponderables were discussed. The patient acknowledged the explanation, agreed to proceed with the plan and consent was signed. Patient is being admitted for inpatient treatment for surgery, pain control, PT, OT, prophylactic antibiotics, VTE prophylaxis, progressive ambulation and ADL's and discharge planning. The patient is planning to be discharged to skilled nursing facility.  [name], PA-C

## 2013-12-12 ENCOUNTER — Inpatient Hospital Stay (HOSPITAL_COMMUNITY): Payer: Medicare Other | Admitting: Anesthesiology

## 2013-12-12 ENCOUNTER — Inpatient Hospital Stay (HOSPITAL_COMMUNITY): Payer: Medicare Other

## 2013-12-12 ENCOUNTER — Inpatient Hospital Stay (HOSPITAL_COMMUNITY)
Admission: RE | Admit: 2013-12-12 | Discharge: 2013-12-14 | DRG: 470 | Disposition: A | Discharge status: Skilled Nursing Facility | Payer: Medicare Other | Source: Ambulatory Visit | Attending: Orthopedic Surgery | Admitting: Orthopedic Surgery

## 2013-12-12 ENCOUNTER — Encounter (HOSPITAL_COMMUNITY)
Admission: RE | Disposition: A | Discharge status: Skilled Nursing Facility | Payer: Self-pay | Source: Ambulatory Visit | Attending: Orthopedic Surgery

## 2013-12-12 ENCOUNTER — Encounter (HOSPITAL_COMMUNITY): Payer: Self-pay | Admitting: *Deleted

## 2013-12-12 DIAGNOSIS — Z471 Aftercare following joint replacement surgery: Secondary | ICD-10-CM | POA: Diagnosis not present

## 2013-12-12 DIAGNOSIS — F329 Major depressive disorder, single episode, unspecified: Secondary | ICD-10-CM | POA: Diagnosis present

## 2013-12-12 DIAGNOSIS — Z87891 Personal history of nicotine dependence: Secondary | ICD-10-CM

## 2013-12-12 DIAGNOSIS — M179 Osteoarthritis of knee, unspecified: Secondary | ICD-10-CM | POA: Diagnosis not present

## 2013-12-12 DIAGNOSIS — Z01818 Encounter for other preprocedural examination: Secondary | ICD-10-CM

## 2013-12-12 DIAGNOSIS — M25661 Stiffness of right knee, not elsewhere classified: Secondary | ICD-10-CM | POA: Diagnosis not present

## 2013-12-12 DIAGNOSIS — E049 Nontoxic goiter, unspecified: Secondary | ICD-10-CM | POA: Diagnosis present

## 2013-12-12 DIAGNOSIS — Z01811 Encounter for preprocedural respiratory examination: Secondary | ICD-10-CM | POA: Diagnosis not present

## 2013-12-12 DIAGNOSIS — R2681 Unsteadiness on feet: Secondary | ICD-10-CM | POA: Diagnosis not present

## 2013-12-12 DIAGNOSIS — Z90710 Acquired absence of both cervix and uterus: Secondary | ICD-10-CM

## 2013-12-12 DIAGNOSIS — Z791 Long term (current) use of non-steroidal anti-inflammatories (NSAID): Secondary | ICD-10-CM

## 2013-12-12 DIAGNOSIS — Z6830 Body mass index (BMI) 30.0-30.9, adult: Secondary | ICD-10-CM

## 2013-12-12 DIAGNOSIS — M1990 Unspecified osteoarthritis, unspecified site: Secondary | ICD-10-CM | POA: Diagnosis not present

## 2013-12-12 DIAGNOSIS — E669 Obesity, unspecified: Secondary | ICD-10-CM | POA: Diagnosis present

## 2013-12-12 DIAGNOSIS — E780 Pure hypercholesterolemia: Secondary | ICD-10-CM | POA: Diagnosis present

## 2013-12-12 DIAGNOSIS — K219 Gastro-esophageal reflux disease without esophagitis: Secondary | ICD-10-CM | POA: Diagnosis not present

## 2013-12-12 DIAGNOSIS — E785 Hyperlipidemia, unspecified: Secondary | ICD-10-CM | POA: Diagnosis not present

## 2013-12-12 DIAGNOSIS — M1711 Unilateral primary osteoarthritis, right knee: Secondary | ICD-10-CM

## 2013-12-12 DIAGNOSIS — G47419 Narcolepsy without cataplexy: Secondary | ICD-10-CM | POA: Diagnosis present

## 2013-12-12 DIAGNOSIS — K5900 Constipation, unspecified: Secondary | ICD-10-CM | POA: Diagnosis not present

## 2013-12-12 DIAGNOSIS — Z85828 Personal history of other malignant neoplasm of skin: Secondary | ICD-10-CM | POA: Diagnosis not present

## 2013-12-12 DIAGNOSIS — G4730 Sleep apnea, unspecified: Secondary | ICD-10-CM | POA: Diagnosis present

## 2013-12-12 DIAGNOSIS — M1710 Unilateral primary osteoarthritis, unspecified knee: Secondary | ICD-10-CM | POA: Diagnosis present

## 2013-12-12 DIAGNOSIS — M8580 Other specified disorders of bone density and structure, unspecified site: Secondary | ICD-10-CM | POA: Diagnosis present

## 2013-12-12 DIAGNOSIS — M6281 Muscle weakness (generalized): Secondary | ICD-10-CM | POA: Diagnosis not present

## 2013-12-12 DIAGNOSIS — Z96651 Presence of right artificial knee joint: Secondary | ICD-10-CM | POA: Diagnosis not present

## 2013-12-12 DIAGNOSIS — R278 Other lack of coordination: Secondary | ICD-10-CM | POA: Diagnosis not present

## 2013-12-12 HISTORY — PX: TOTAL KNEE ARTHROPLASTY: SHX125

## 2013-12-12 LAB — TYPE AND SCREEN
ABO/RH(D): B POS
Antibody Screen: NEGATIVE

## 2013-12-12 LAB — ABO/RH: ABO/RH(D): B POS

## 2013-12-12 SURGERY — ARTHROPLASTY, KNEE, TOTAL
Anesthesia: Spinal | Site: Knee | Laterality: Right

## 2013-12-12 MED ORDER — SODIUM CHLORIDE 0.9 % IR SOLN
Status: DC | PRN
  Administered 2013-12-12: 1000 mL

## 2013-12-12 MED ORDER — ACETAMINOPHEN 325 MG PO TABS: 650.0000 mg | ORAL_TABLET | Freq: Four times a day (QID) | ORAL | Status: DC | PRN

## 2013-12-12 MED ORDER — PHENOL 1.4 % MT LIQD: 1.0000 | OROMUCOSAL | Status: DC | PRN

## 2013-12-12 MED ORDER — METHOCARBAMOL 500 MG PO TABS
500.0000 mg | ORAL_TABLET | Freq: Four times a day (QID) | ORAL | Status: DC | PRN
  Administered 2013-12-13 – 2013-12-14 (×4): 500 mg via ORAL
  Filled 2013-12-12 (×5): qty 1

## 2013-12-12 MED ORDER — DEXAMETHASONE SODIUM PHOSPHATE 10 MG/ML IJ SOLN
10.0000 mg | Freq: Once | INTRAMUSCULAR | Status: AC
  Administered 2013-12-13: 10 mg via INTRAVENOUS
  Filled 2013-12-12: qty 1

## 2013-12-12 MED ORDER — ONDANSETRON HCL 4 MG/2ML IJ SOLN
INTRAMUSCULAR | Status: DC | PRN
  Administered 2013-12-12: 4 mg via INTRAVENOUS

## 2013-12-12 MED ORDER — DOCUSATE SODIUM 100 MG PO CAPS
100.0000 mg | ORAL_CAPSULE | Freq: Two times a day (BID) | ORAL | Status: DC
  Administered 2013-12-12 – 2013-12-14 (×4): 100 mg via ORAL

## 2013-12-12 MED ORDER — PROPOFOL 10 MG/ML IV BOLUS
INTRAVENOUS | Status: DC | PRN
  Administered 2013-12-12: 150 mg via INTRAVENOUS

## 2013-12-12 MED ORDER — BUPIVACAINE LIPOSOME 1.3 % IJ SUSP
20.0000 mL | Freq: Once | INTRAMUSCULAR | Status: AC
  Administered 2013-12-12: 20 mL
  Filled 2013-12-12: qty 20

## 2013-12-12 MED ORDER — PROPOFOL 10 MG/ML IV BOLUS
INTRAVENOUS | Status: AC
  Filled 2013-12-12: qty 20

## 2013-12-12 MED ORDER — CEFAZOLIN SODIUM-DEXTROSE 2-3 GM-% IV SOLR
INTRAVENOUS | Status: AC
  Filled 2013-12-12: qty 50

## 2013-12-12 MED ORDER — 0.9 % SODIUM CHLORIDE (POUR BTL) OPTIME
TOPICAL | Status: DC | PRN
  Administered 2013-12-12: 1000 mL

## 2013-12-12 MED ORDER — HYDROMORPHONE HCL 2 MG PO TABS
2.0000 mg | ORAL_TABLET | ORAL | Status: DC | PRN
  Administered 2013-12-12 (×2): 2 mg via ORAL
  Administered 2013-12-13 (×4): 4 mg via ORAL
  Administered 2013-12-14 (×2): 2 mg via ORAL
  Administered 2013-12-14: 4 mg via ORAL
  Filled 2013-12-12 (×3): qty 2
  Filled 2013-12-12: qty 1
  Filled 2013-12-12 (×3): qty 2
  Filled 2013-12-12 (×2): qty 1

## 2013-12-12 MED ORDER — METOCLOPRAMIDE HCL 10 MG PO TABS: 5.0000 mg | ORAL_TABLET | Freq: Three times a day (TID) | ORAL | Status: DC | PRN

## 2013-12-12 MED ORDER — FENTANYL CITRATE 0.05 MG/ML IJ SOLN
INTRAMUSCULAR | Status: DC | PRN
  Administered 2013-12-12: 50 ug via INTRAVENOUS
  Administered 2013-12-12: 25 ug via INTRAVENOUS
  Administered 2013-12-12 (×2): 50 ug via INTRAVENOUS
  Administered 2013-12-12: 25 ug via INTRAVENOUS
  Administered 2013-12-12 (×2): 50 ug via INTRAVENOUS

## 2013-12-12 MED ORDER — PANTOPRAZOLE SODIUM 40 MG PO TBEC
40.0000 mg | DELAYED_RELEASE_TABLET | Freq: Every day | ORAL | Status: DC
  Administered 2013-12-12 – 2013-12-13 (×2): 40 mg via ORAL
  Filled 2013-12-12 (×3): qty 1

## 2013-12-12 MED ORDER — SODIUM CHLORIDE 0.9 % IJ SOLN
INTRAMUSCULAR | Status: AC
  Filled 2013-12-12: qty 50

## 2013-12-12 MED ORDER — AMPHETAMINE-DEXTROAMPHETAMINE 10 MG PO TABS
20.0000 mg | ORAL_TABLET | Freq: Four times a day (QID) | ORAL | Status: DC
  Administered 2013-12-12 – 2013-12-14 (×8): 20 mg via ORAL
  Filled 2013-12-12 (×8): qty 2

## 2013-12-12 MED ORDER — KETOROLAC TROMETHAMINE 15 MG/ML IJ SOLN
7.5000 mg | Freq: Four times a day (QID) | INTRAMUSCULAR | Status: AC | PRN
  Administered 2013-12-12: 7.5 mg via INTRAVENOUS
  Filled 2013-12-12: qty 1

## 2013-12-12 MED ORDER — MIDAZOLAM HCL 2 MG/2ML IJ SOLN
INTRAMUSCULAR | Status: AC
  Filled 2013-12-12: qty 2

## 2013-12-12 MED ORDER — BUPIVACAINE HCL (PF) 0.25 % IJ SOLN
INTRAMUSCULAR | Status: AC
  Filled 2013-12-12: qty 30

## 2013-12-12 MED ORDER — ACETAMINOPHEN 500 MG PO TABS
1000.0000 mg | ORAL_TABLET | Freq: Four times a day (QID) | ORAL | Status: AC
  Administered 2013-12-12 – 2013-12-13 (×3): 1000 mg via ORAL
  Filled 2013-12-12 (×3): qty 2

## 2013-12-12 MED ORDER — MENTHOL 3 MG MT LOZG: 1.0000 | LOZENGE | OROMUCOSAL | Status: DC | PRN

## 2013-12-12 MED ORDER — DEXTROSE 5 % IV SOLN
500.0000 mg | Freq: Four times a day (QID) | INTRAVENOUS | Status: DC | PRN
  Administered 2013-12-12: 500 mg via INTRAVENOUS
  Filled 2013-12-12 (×2): qty 5

## 2013-12-12 MED ORDER — FENTANYL CITRATE 0.05 MG/ML IJ SOLN
INTRAMUSCULAR | Status: AC
  Filled 2013-12-12: qty 2

## 2013-12-12 MED ORDER — SODIUM CHLORIDE 0.9 % IJ SOLN
INTRAMUSCULAR | Status: DC | PRN
  Administered 2013-12-12: 30 mL via INTRAVENOUS

## 2013-12-12 MED ORDER — ACETAMINOPHEN 10 MG/ML IV SOLN
1000.0000 mg | Freq: Once | INTRAVENOUS | Status: AC
  Administered 2013-12-12: 1000 mg via INTRAVENOUS
  Filled 2013-12-12: qty 100

## 2013-12-12 MED ORDER — BISACODYL 10 MG RE SUPP: 10.0000 mg | Freq: Every day | RECTAL | Status: DC | PRN

## 2013-12-12 MED ORDER — DIPHENHYDRAMINE HCL 12.5 MG/5ML PO ELIX: 12.5000 mg | ORAL_SOLUTION | ORAL | Status: DC | PRN

## 2013-12-12 MED ORDER — POLYETHYLENE GLYCOL 3350 17 G PO PACK
17.0000 g | PACK | Freq: Every day | ORAL | Status: DC | PRN
Start: 2013-12-12 — End: 2013-12-14
  Administered 2013-12-13: 17 g via ORAL
  Filled 2013-12-12: qty 1

## 2013-12-12 MED ORDER — MIDAZOLAM HCL 5 MG/5ML IJ SOLN
INTRAMUSCULAR | Status: DC | PRN
  Administered 2013-12-12: 2 mg via INTRAVENOUS
  Administered 2013-12-12: 1 mg via INTRAVENOUS

## 2013-12-12 MED ORDER — DEXTROSE-NACL 5-0.45 % IV SOLN
INTRAVENOUS | Status: DC
  Administered 2013-12-12 – 2013-12-13 (×2): via INTRAVENOUS

## 2013-12-12 MED ORDER — LACTATED RINGERS IV SOLN
INTRAVENOUS | Status: DC
  Administered 2013-12-12: 1 via INTRAVENOUS
  Administered 2013-12-12: 1000 mL via INTRAVENOUS

## 2013-12-12 MED ORDER — ONDANSETRON HCL 4 MG/2ML IJ SOLN: 4.0000 mg | Freq: Four times a day (QID) | INTRAMUSCULAR | Status: DC | PRN

## 2013-12-12 MED ORDER — LIDOCAINE HCL (CARDIAC) 20 MG/ML IV SOLN
INTRAVENOUS | Status: DC | PRN
  Administered 2013-12-12: 50 mg via INTRAVENOUS

## 2013-12-12 MED ORDER — CEFAZOLIN SODIUM-DEXTROSE 2-3 GM-% IV SOLR
2.0000 g | Freq: Four times a day (QID) | INTRAVENOUS | Status: AC
  Administered 2013-12-12 (×2): 2 g via INTRAVENOUS
  Filled 2013-12-12 (×2): qty 50

## 2013-12-12 MED ORDER — BUPIVACAINE HCL 0.25 % IJ SOLN
INTRAMUSCULAR | Status: DC | PRN
  Administered 2013-12-12: 30 mL

## 2013-12-12 MED ORDER — ONDANSETRON HCL 4 MG/2ML IJ SOLN: 4.0000 mg | Freq: Once | INTRAMUSCULAR | Status: DC | PRN

## 2013-12-12 MED ORDER — DEXAMETHASONE SODIUM PHOSPHATE 10 MG/ML IJ SOLN
INTRAMUSCULAR | Status: AC
Start: 2013-12-12 — End: 2013-12-12
  Filled 2013-12-12: qty 1

## 2013-12-12 MED ORDER — HYDROMORPHONE HCL 1 MG/ML IJ SOLN
0.5000 mg | INTRAMUSCULAR | Status: DC | PRN
  Administered 2013-12-12: 0.5 mg via INTRAVENOUS
  Administered 2013-12-13: 1 mg via INTRAVENOUS
  Filled 2013-12-12 (×2): qty 1

## 2013-12-12 MED ORDER — DULOXETINE HCL 30 MG PO CPEP
30.0000 mg | ORAL_CAPSULE | Freq: Every evening | ORAL | Status: DC
  Administered 2013-12-13: 30 mg via ORAL
  Filled 2013-12-12 (×3): qty 1

## 2013-12-12 MED ORDER — DEXAMETHASONE SODIUM PHOSPHATE 10 MG/ML IJ SOLN
10.0000 mg | Freq: Once | INTRAMUSCULAR | Status: AC
  Administered 2013-12-12: 10 mg via INTRAVENOUS

## 2013-12-12 MED ORDER — SODIUM CHLORIDE 0.9 % IV SOLN: INTRAVENOUS | Status: DC

## 2013-12-12 MED ORDER — LACTATED RINGERS IV SOLN
INTRAVENOUS | Status: DC | PRN
  Administered 2013-12-12 (×2): via INTRAVENOUS

## 2013-12-12 MED ORDER — FLEET ENEMA 7-19 GM/118ML RE ENEM: 1.0000 | ENEMA | Freq: Once | RECTAL | Status: AC | PRN

## 2013-12-12 MED ORDER — CEFAZOLIN SODIUM-DEXTROSE 2-3 GM-% IV SOLR
2.0000 g | INTRAVENOUS | Status: AC
  Administered 2013-12-12: 2 g via INTRAVENOUS

## 2013-12-12 MED ORDER — CHLORHEXIDINE GLUCONATE 4 % EX LIQD: 60.0000 mL | Freq: Once | CUTANEOUS | Status: DC

## 2013-12-12 MED ORDER — PROPOFOL INFUSION 10 MG/ML OPTIME
INTRAVENOUS | Status: DC | PRN
  Administered 2013-12-12: 50 ug/kg/min via INTRAVENOUS

## 2013-12-12 MED ORDER — METOCLOPRAMIDE HCL 5 MG/ML IJ SOLN: 5.0000 mg | Freq: Three times a day (TID) | INTRAMUSCULAR | Status: DC | PRN

## 2013-12-12 MED ORDER — ONDANSETRON HCL 4 MG PO TABS
4.0000 mg | ORAL_TABLET | Freq: Four times a day (QID) | ORAL | Status: DC | PRN
Start: 2013-12-12 — End: 2013-12-14

## 2013-12-12 MED ORDER — ACETAMINOPHEN 650 MG RE SUPP: 650.0000 mg | Freq: Four times a day (QID) | RECTAL | Status: DC | PRN

## 2013-12-12 MED ORDER — ATORVASTATIN CALCIUM 40 MG PO TABS
40.0000 mg | ORAL_TABLET | Freq: Every day | ORAL | Status: DC
  Administered 2013-12-12 – 2013-12-13 (×2): 40 mg via ORAL
  Filled 2013-12-12 (×3): qty 1

## 2013-12-12 MED ORDER — ONDANSETRON HCL 4 MG/2ML IJ SOLN
INTRAMUSCULAR | Status: AC
  Filled 2013-12-12: qty 2

## 2013-12-12 MED ORDER — FENTANYL CITRATE 0.05 MG/ML IJ SOLN
25.0000 ug | INTRAMUSCULAR | Status: DC | PRN
  Administered 2013-12-12 (×2): 25 ug via INTRAVENOUS
  Administered 2013-12-12: 50 ug via INTRAVENOUS
  Administered 2013-12-12 (×2): 25 ug via INTRAVENOUS

## 2013-12-12 MED ORDER — RIVAROXABAN 10 MG PO TABS
10.0000 mg | ORAL_TABLET | Freq: Every day | ORAL | Status: DC
  Administered 2013-12-13 – 2013-12-14 (×2): 10 mg via ORAL
  Filled 2013-12-12 (×3): qty 1

## 2013-12-12 MED ORDER — TRANEXAMIC ACID 100 MG/ML IV SOLN
1000.0000 mg | INTRAVENOUS | Status: AC
  Administered 2013-12-12: 1000 mg via INTRAVENOUS
  Filled 2013-12-12: qty 10

## 2013-12-12 MED ORDER — LIDOCAINE HCL (CARDIAC) 20 MG/ML IV SOLN
INTRAVENOUS | Status: AC
  Filled 2013-12-12: qty 5

## 2013-12-12 MED ORDER — BUPIVACAINE IN DEXTROSE 0.75-8.25 % IT SOLN
INTRATHECAL | Status: DC | PRN
  Administered 2013-12-12: 2 mL via INTRATHECAL

## 2013-12-12 SURGICAL SUPPLY — 56 items
BAG ZIPLOCK 12X15 (MISCELLANEOUS) ×2 IMPLANT
BANDAGE ELASTIC 6 VELCRO ST LF (GAUZE/BANDAGES/DRESSINGS) ×2 IMPLANT
BANDAGE ESMARK 6X9 LF (GAUZE/BANDAGES/DRESSINGS) ×1 IMPLANT
BLADE SAG 18X100X1.27 (BLADE) ×2 IMPLANT
BLADE SAW SGTL 11.0X1.19X90.0M (BLADE) ×2 IMPLANT
BNDG ESMARK 6X9 LF (GAUZE/BANDAGES/DRESSINGS) ×2
BOWL SMART MIX CTS (DISPOSABLE) ×2 IMPLANT
CAPT KNEE TOTAL 3 ATTUNE ×2 IMPLANT
CEMENT HV SMART SET (Cement) ×4 IMPLANT
CUFF TOURN SGL QUICK 34 (TOURNIQUET CUFF) ×1
CUFF TRNQT CYL 34X4X40X1 (TOURNIQUET CUFF) ×1 IMPLANT
DECANTER SPIKE VIAL GLASS SM (MISCELLANEOUS) ×2 IMPLANT
DRAPE EXTREMITY T 121X128X90 (DRAPE) ×2 IMPLANT
DRAPE POUCH INSTRU U-SHP 10X18 (DRAPES) ×2 IMPLANT
DRAPE U-SHAPE 47X51 STRL (DRAPES) ×2 IMPLANT
DRSG ADAPTIC 3X8 NADH LF (GAUZE/BANDAGES/DRESSINGS) ×2 IMPLANT
DURAPREP 26ML APPLICATOR (WOUND CARE) ×2 IMPLANT
ELECT REM PT RETURN 9FT ADLT (ELECTROSURGICAL) ×2
ELECTRODE REM PT RTRN 9FT ADLT (ELECTROSURGICAL) ×1 IMPLANT
EVACUATOR 1/8 PVC DRAIN (DRAIN) ×2 IMPLANT
FACESHIELD WRAPAROUND (MASK) ×10 IMPLANT
GAUZE SPONGE 4X4 12PLY STRL (GAUZE/BANDAGES/DRESSINGS) ×2 IMPLANT
GLOVE BIO SURGEON STRL SZ7.5 (GLOVE) IMPLANT
GLOVE BIO SURGEON STRL SZ8 (GLOVE) ×2 IMPLANT
GLOVE BIOGEL PI IND STRL 6.5 (GLOVE) IMPLANT
GLOVE BIOGEL PI IND STRL 8 (GLOVE) ×1 IMPLANT
GLOVE BIOGEL PI INDICATOR 6.5 (GLOVE)
GLOVE BIOGEL PI INDICATOR 8 (GLOVE) ×1
GLOVE SURG SS PI 6.5 STRL IVOR (GLOVE) IMPLANT
GOWN STRL REUS W/TWL LRG LVL3 (GOWN DISPOSABLE) ×2 IMPLANT
GOWN STRL REUS W/TWL XL LVL3 (GOWN DISPOSABLE) IMPLANT
HANDPIECE INTERPULSE COAX TIP (DISPOSABLE) ×1
IMMOBILIZER KNEE 20 (SOFTGOODS) ×2 IMPLANT
KIT BASIN OR (CUSTOM PROCEDURE TRAY) ×2 IMPLANT
MANIFOLD NEPTUNE II (INSTRUMENTS) ×2 IMPLANT
NDL SAFETY ECLIPSE 18X1.5 (NEEDLE) ×2 IMPLANT
NEEDLE HYPO 18GX1.5 SHARP (NEEDLE) ×2
NS IRRIG 1000ML POUR BTL (IV SOLUTION) ×2 IMPLANT
PACK TOTAL JOINT (CUSTOM PROCEDURE TRAY) ×2 IMPLANT
PAD ABD 8X10 STRL (GAUZE/BANDAGES/DRESSINGS) ×2 IMPLANT
PADDING CAST COTTON 6X4 STRL (CAST SUPPLIES) ×2 IMPLANT
POSITIONER SURGICAL ARM (MISCELLANEOUS) ×2 IMPLANT
SET HNDPC FAN SPRY TIP SCT (DISPOSABLE) ×1 IMPLANT
STRIP CLOSURE SKIN 1/2X4 (GAUZE/BANDAGES/DRESSINGS) ×2 IMPLANT
SUCTION FRAZIER 12FR DISP (SUCTIONS) ×2 IMPLANT
SUT MNCRL AB 4-0 PS2 18 (SUTURE) ×2 IMPLANT
SUT VIC AB 2-0 CT1 27 (SUTURE) ×3
SUT VIC AB 2-0 CT1 TAPERPNT 27 (SUTURE) ×3 IMPLANT
SUT VLOC 180 0 24IN GS25 (SUTURE) ×2 IMPLANT
SYR 20CC LL (SYRINGE) ×2 IMPLANT
SYR 50ML LL SCALE MARK (SYRINGE) ×2 IMPLANT
TOWEL OR 17X26 10 PK STRL BLUE (TOWEL DISPOSABLE) ×2 IMPLANT
TOWEL OR NON WOVEN STRL DISP B (DISPOSABLE) IMPLANT
TRAY FOLEY CATH 14FRSI W/METER (CATHETERS) ×2 IMPLANT
WATER STERILE IRR 1500ML POUR (IV SOLUTION) ×2 IMPLANT
WRAP KNEE MAXI GEL POST OP (GAUZE/BANDAGES/DRESSINGS) ×2 IMPLANT

## 2013-12-12 NOTE — Progress Notes (Signed)
Clinical Social Work Department CLINICAL SOCIAL WORK PLACEMENT NOTE 12/12/2013  Patient:  Carla [name], Carla [name]  Account Number:  [id number] Admit date:  12/12/2013  Clinical Social Worker:  Werner Lean, LCSW  Date/time:  12/12/2013 01:51 PM  Clinical Social Work is seeking post-discharge placement for this patient at the following level of care:   SKILLED NURSING   (*CSW will update this form in Epic as items are completed)     Patient/family provided with [place] Department of Clinical Social Work's list of facilities offering this level of care within the geographic area requested by the patient (or if unable, by the patient's family).  12/12/2013  Patient/family informed of their freedom to choose among providers that offer the needed level of care, that participate in Medicare, Medicaid or managed care program needed by the patient, have an available bed and are willing to accept the patient.    Patient/family informed of [facility]' ownership interest in [facility], as well as of the fact that they are under no obligation to receive care at this facility.  PASARR submitted to EDS on 12/12/2013 PASARR number received on 12/12/2013  FL2 transmitted to all facilities in geographic area requested by pt/family on  12/12/2013 FL2 transmitted to all facilities within larger geographic area on   Patient informed that his/her managed care company has contracts with or will negotiate with  certain facilities, including the following:     Patient/family informed of bed offers received:  12/12/2013 Patient chooses bed at [facility] Physician recommends and patient chooses bed at    Patient to be transferred to  on   Patient to be transferred to facility by  Patient and family notified of transfer on  Name of family member notified:    The following physician request were entered in Epic:   Additional Comments:   Werner Lean LCSW [phone]

## 2013-12-12 NOTE — Plan of Care (Signed)
Problem: Phase I Progression Outcomes Goal: Pain controlled with appropriate interventions Outcome: Completed/Met Date Met:  12/12/13     

## 2013-12-12 NOTE — Progress Notes (Signed)
Clinical Social Work Department BRIEF PSYCHOSOCIAL ASSESSMENT 12/12/2013  Patient:  LEWIS, GRIVAS     Account Number:  [id number]     Admit date:  12/12/2013  Clinical Social Worker:  Lacie Scotts  Date/Time:  12/12/2013 01:38 PM  Referred by:  Physician  Date Referred:  12/12/2013 Referred for  SNF Placement   Other Referral:   Interview type:   Other interview type:    PSYCHOSOCIAL DATA Living Status:  WITH ADULT CHILDREN Admitted from facility:   Level of care:   Primary support name:  Raeford Razor Primary support relationship to patient:  CHILD, ADULT Degree of support available:   supportive    CURRENT CONCERNS Current Concerns  Post-Acute Placement   Other Concerns:    SOCIAL WORK ASSESSMENT / PLAN Pt is a 63 yr old female living at home prior to hospitalization. CSW met with pt / daughter to assist with d/c planning. This is a planned admission. Pt has made prior arrangements to have ST Rehab at [facility] following hospital d/c. CSW has contacted SNF and d/c plans have been confirmed. CSW will continue to follow to assist with d/c planning to SNF.   Assessment/plan status:   Other assessment/ plan:   Information/referral to community resources:   Insurance coverage for SNF and ambulance transport reviewed.    PATIENT'S/FAMILY'S RESPONSE TO PLAN OF CARE: Pt is sleepy having completed surgery this am. Pt looking forward to having her rebah at [facility]. Daughter knows the Director of PT at [facility] and is confident pt will receive quality care.    Werner Lean LCSW [phone]

## 2013-12-12 NOTE — Progress Notes (Signed)
Utilization review completed.  

## 2013-12-12 NOTE — Anesthesia Postprocedure Evaluation (Signed)
  Anesthesia Post-op Note  Patient: Carla [name]  Procedure(s) Performed: Procedure(s) (LRB): RIGHT TOTAL KNEE ARTHROPLASTY (Right)  Patient Location: PACU  Anesthesia Type: General  Level of Consciousness: awake and alert   Airway and Oxygen Therapy: Patient Spontanous Breathing  Post-op Pain: mild  Post-op Assessment: Post-op Vital signs reviewed, Patient's Cardiovascular Status Stable, Respiratory Function Stable, Patent Airway and No signs of Nausea or vomiting  Last Vitals:  Filed Vitals:   12/12/13 0945  BP: 145/64  Pulse: 64  Temp: 36.4 C  Resp: 15    Post-op Vital Signs: stable   Complications: No apparent anesthesia complications

## 2013-12-12 NOTE — Anesthesia Procedure Notes (Signed)
Spinal  End time: 12/12/2013 8:10 AM Staffing Resident/CRNA: Enrigue Catena E Preanesthetic Checklist Completed: patient identified, site marked, surgical consent, pre-op evaluation, timeout performed, IV checked, risks and benefits discussed and monitors and equipment checked Spinal Block Patient position: sitting Prep: ChloraPrep Approach: right paramedian Location: L2-3 Injection technique: single-shot Needle Needle type: Sprotte  Needle gauge: 24 G Additional Notes Pt tolerated procedure well. Spinal kit and drugs within date . Left paresthesia needle redirected with  o paresthesia prior to injection CSFx 3.

## 2013-12-12 NOTE — Addendum Note (Signed)
Addendum  created 12/12/13 1127 by Lissa Morales, CRNA   Modules edited: Anesthesia Blocks and Procedures, Anesthesia Medication Administration, Clinical Notes   Clinical Notes:  File: [id number]

## 2013-12-12 NOTE — Plan of Care (Signed)
Problem: Phase I Progression Outcomes Goal: [place] or out of bed evening of surgery Outcome: Completed/Met Date Met:  12/12/13     

## 2013-12-12 NOTE — Transfer of Care (Signed)
Immediate Anesthesia Transfer of Care Note  Patient: Carla [name]  Procedure(s) Performed: Procedure(s): RIGHT TOTAL KNEE ARTHROPLASTY (Right)  Patient Location: PACU  Anesthesia Type:General  Level of Consciousness: awake, alert , oriented and patient cooperative  Airway & Oxygen Therapy: Patient Spontanous Breathing and Patient connected to face mask oxygen  Post-op Assessment: Report given to PACU RN and Post -op Vital signs reviewed and stable  Post vital signs: stable  Complications: No apparent anesthesia complications  L4

## 2013-12-12 NOTE — Interval H&P Note (Signed)
History and Physical Interval Note:  12/12/2013 6:42 AM  Carla [name]  has presented today for surgery, with the diagnosis of right knee osteoarthritis  The various methods of treatment have been discussed with the patient and family. After consideration of risks, benefits and other options for treatment, the patient has consented to  Procedure(s): RIGHT TOTAL KNEE ARTHROPLASTY (Right) as a surgical intervention .  The patient's history has been reviewed, patient examined, no change in status, stable for surgery.  I have reviewed the patient's chart and labs.  Questions were answered to the patient's satisfaction.     [name]

## 2013-12-12 NOTE — Op Note (Signed)
Pre-operative diagnosis- Osteoarthritis  Right knee(s)  Post-operative diagnosis- Osteoarthritis Right knee(s)  Procedure-  Right  Total Knee Arthroplasty (Attune system)  Surgeon- Dione Plover. Ryllie Nieland, MD  Assistant- [name], PA-C   Anesthesia-  General and Spinal  EBL-* No blood loss amount entered *   Drains Hemovac  Tourniquet time-  Total Tourniquet Time Documented: Thigh (Right) - 37 minutes Total: Thigh (Right) - 37 minutes     Complications- None  Condition-PACU - hemodynamically stable.   Brief Clinical Note  Carla [name] is a 63 y.o. year old female with end stage OA of her right knee with progressively worsening pain and dysfunction. She has constant pain, with activity and at rest and significant functional deficits with difficulties even with ADLs. She has had extensive non-op management including analgesics, injections of cortisone, and home exercise program, but remains in significant pain with significant dysfunction.Radiographs show bone on bone arthritis medial and patellofemoral. She presents now for right Total Knee Arthroplasty.    Procedure in detail---   The patient is brought into the operating room and positioned supine on the operating table. After successful administration of  Spinal then General anesthesia,   a tourniquet is placed high on the  Right thigh(s) and the lower extremity is prepped and draped in the usual sterile fashion. Time out is performed by the operating team and then the  Right lower extremity is wrapped in Esmarch, knee flexed and the tourniquet inflated to 300 mmHg.       A midline incision is made with a ten blade through the subcutaneous tissue to the level of the extensor mechanism. A fresh blade is used to make a medial parapatellar arthrotomy. Soft tissue over the proximal medial tibia is subperiosteally elevated to the joint line with a knife and into the semimembranosus bursa with a Cobb elevator. Soft tissue over the proximal  lateral tibia is elevated with attention being paid to avoiding the patellar tendon on the tibial tubercle. The patella is everted, knee flexed 90 degrees and the ACL and PCL are removed. Findings are bone on bone medial with exposed bone patellofemoral        The drill is used to create a starting hole in the distal femur and the canal is thoroughly irrigated with sterile saline to remove the fatty contents. The 5 degree Right  valgus alignment guide is placed into the femoral canal and the distal femoral cutting block is pinned to remove 9 mm off the distal femur. Resection is made with an oscillating saw.      The tibia is subluxed forward and the menisci are removed. The extramedullary alignment guide is placed referencing proximally at the medial aspect of the tibial tubercle and distally along the second metatarsal axis and tibial crest. The block is pinned to remove 61mm off the more deficient medial  side. Resection is made with an oscillating saw. Size 5is the most appropriate size for the tibia and the proximal tibia is prepared with the modular drill and keel punch for that size.      The femoral sizing guide is placed and size 5 is most appropriate. Rotation is marked off the epicondylar axis and confirmed by creating a rectangular flexion gap at 90 degrees. The size 5 cutting block is pinned in this rotation and the anterior, posterior and chamfer cuts are made with the oscillating saw. The intercondylar block is then placed and that cut is made.      Trial size 5 tibial  component, trial size 5 posterior stabilized femur and a 8  mm posterior stabilized rotating platform insert trial is placed. Full extension is achieved with excellent varus/valgus and anterior/posterior balance throughout full range of motion. The patella is everted and thickness measured to be 24  mm. Free hand resection is taken to 14 mm, a 38 template is placed, lug holes are drilled, trial patella is placed, and it tracks  normally. Osteophytes are removed off the posterior femur with the trial in place. All trials are removed and the cut bone surfaces prepared with pulsatile lavage. Cement is mixed and once ready for implantation, the size 5 tibial implant, size  5 posterior stabilized femoral component, and the size 38 patella are cemented in place and the patella is held with the clamp. The trial insert is placed and the knee held in full extension. The Exparel (20 ml mixed with 30 ml saline) and .25% Bupivicaine, are injected into the extensor mechanism, posterior capsule, medial and lateral gutters and subcutaneous tissues.  All extruded cement is removed and once the cement is hard the permanent 8 mm posterior stabilized rotating platform insert is placed into the tibial tray.      The wound is copiously irrigated with saline solution and the extensor mechanism closed over a hemovac drain with #1 V-loc suture. The tourniquet is released for a total tourniquet time of 37  minutes. Flexion against gravity is 140 degrees and the patella tracks normally. Subcutaneous tissue is closed with 2.0 vicryl and subcuticular with running 4.0 Monocryl. The incision is cleaned and dried and steri-strips and a bulky sterile dressing are applied. The limb is placed into a knee immobilizer and the patient is awakened and transported to recovery in stable condition.      Please note that a surgical assistant was a medical necessity for this procedure in order to perform it in a safe and expeditious manner. Surgical assistant was necessary to retract the ligaments and vital neurovascular structures to prevent injury to them and also necessary for proper positioning of the limb to allow for anatomic placement of the prosthesis.   Dione Plover Carla Vellucci, MD    12/12/2013, 9:17 AM

## 2013-12-12 NOTE — Plan of Care (Signed)
Problem: Consults Goal: Total Joint Replacement Patient Education See Patient Education Module for education specifics. Outcome: Completed/Met Date Met:  12/12/13

## 2013-12-12 NOTE — Plan of Care (Signed)
Problem: Phase I Progression Outcomes Goal: CMS/Neurovascular status WDL Outcome: Completed/Met Date Met:  12/12/13     

## 2013-12-12 NOTE — Plan of Care (Signed)
Problem: Phase I Progression Outcomes Goal: Hemodynamically stable Outcome: Completed/Met Date Met:  12/12/13     

## 2013-12-12 NOTE — Evaluation (Signed)
Physical Therapy Evaluation Patient Details Name: Carla [name] MRN: [id number] DOB: [birth year] Today's Date: 12/12/2013   History of Present Illness  Pt is a 63 year old female s/p R TKA with hx of L patellar surgery due to fx and narcolepsy.  Clinical Impression  Pt is s/p R TKA resulting in the deficits listed below (see PT Problem List).  Pt will benefit from skilled PT to increase their independence and safety with mobility to allow discharge to the venue listed below.  Pt mobilizing well POD #0 and able to tolerate short distance ambulation in hallway.  Pt plans to d/c to SNF.     Follow Up Recommendations SNF    Equipment Recommendations  None recommended by PT    Recommendations for Other Services       Precautions / Restrictions Precautions Precautions: Knee Required Braces or Orthoses: Knee Immobilizer - Right Knee Immobilizer - Right: Discontinue once straight leg raise with < 10 degree lag Restrictions Other Position/Activity Restrictions: WBAT      Mobility  Bed Mobility Overal bed mobility: Needs Assistance Bed Mobility: Supine to Sit     Supine to sit: Min assist     General bed mobility comments: assist for R LE  Transfers Overall transfer level: Needs assistance Equipment used: Rolling walker (2 wheeled) Transfers: Sit to/from Stand Sit to Stand: Min assist         General transfer comment: verbal cues for UE and LE positioning  Ambulation/Gait Ambulation/Gait assistance: Min assist Ambulation Distance (Feet): 60 Feet Assistive device: Rolling walker (2 wheeled) Gait Pattern/deviations: Step-to pattern;Antalgic;Decreased stance time - right     General Gait Details: verbal cues for step length, sequence, RW distance, posture  Stairs            Wheelchair Mobility    Modified Rankin (Stroke Patients Only)       Balance                                             Pertinent Vitals/Pain Pain Assessment:  0-10 Pain Score: 3  Pain Location: R knee Pain Descriptors / Indicators: Sore;Aching Pain Intervention(s): Limited activity within patient's tolerance;Monitored during session;Repositioned    Home Living Family/patient expects to be discharged to:: Skilled nursing facility Living Arrangements: Alone                    Prior Function Level of Independence: Independent               Hand Dominance        Extremity/Trunk Assessment               Lower Extremity Assessment: RLE deficits/detail RLE Deficits / Details: unable to perform SLR, maintained KI       Communication   Communication: No difficulties  Cognition Arousal/Alertness: Awake/alert Behavior During Therapy: WFL for tasks assessed/performed Overall Cognitive Status: Within Functional Limits for tasks assessed                      General Comments      Exercises        Assessment/Plan    PT Assessment Patient needs continued PT services  PT Diagnosis Difficulty walking   PT Problem List Decreased strength;Decreased range of motion;Decreased mobility;Decreased knowledge of precautions;Decreased knowledge of use of DME;Pain  PT Treatment Interventions Functional mobility training;Gait  training;DME instruction;Patient/family education;Therapeutic activities;Therapeutic exercise   PT Goals (Current goals can be found in the Care Plan section) Acute Rehab PT Goals PT Goal Formulation: With patient Time For Goal Achievement: 12/17/13 Potential to Achieve Goals: Good    Frequency 7X/week   Barriers to discharge        Co-evaluation               End of Session Equipment Utilized During Treatment: Gait belt Activity Tolerance: Patient tolerated treatment well Patient left: in chair;with call bell/phone within reach;with family/visitor present           Time: 8416-6063 PT Time Calculation (min) (ACUTE ONLY): 15 min   Charges:   PT Evaluation $Initial PT  Evaluation Tier I: 1 Procedure PT Treatments $Gait Training: 8-22 mins   PT G Codes:          Carla [name],[name] 12/12/2013, 4:45 PM Carmelia Bake, PT, DPT 12/12/2013 Pager: [phone]

## 2013-12-12 NOTE — H&P (View-Only) (Signed)
TOTAL KNEE ADMISSION H&P  Patient is being admitted for right total knee arthroplasty.  Subjective:  Chief Complaint:right knee pain.  HPI: Carla [name], 63 y.o. female, has a history of pain and functional disability in the right knee due to arthritis and has failed non-surgical conservative treatments for greater than 12 weeks to includeNSAID's and/or analgesics and corticosteriod injections.  Onset of symptoms was gradual, starting a couple of  years ago with gradually worsening course since that time. The patient noted prior procedures on the knee to include  arthroscopy on the right knee(s).  Patient currently rates pain in the right knee(s) at moderate activity. Patient has night pain, worsening of pain with activity and weight bearing and pain that interferes with activities of daily living.  Patient has evidence of joint space narrowing by imaging studies. This patient has had previous cortisone and viscosupplementation injections. There is no active infection.  Patient Active Problem List   Diagnosis Date Noted  . 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 Narcolepsy   . Hypercholesteremia   . Arthritis   . Shortness of breath dyspnea     "when flat on my back"  . Numbness     hands  . Osteopenia   . History of skin cancer   . GERD (gastroesophageal reflux disease)   . Goiter   . Sleep apnea     "very mild" pt states does not need c pap - last sleep study several yrs ago in [place]  . Depression     Past Surgical History  Procedure Laterality Date  . Abdominal hysterectomy    . Patellectomy  07/24/2011    Procedure: PATELLECTOMY;  Surgeon: Marin Shutter, MD;  Location: [facility];  Service: Orthopedics;  Laterality: Left;  PARTIAL PATTELECTOMY  . Shoulder surgery  09/30/13    left  . Fracture surgery      l knee for fx patella  . Knee arthroscopy    . Ankle fracture surgery  2010    bilateral     (Not in a hospital admission) Allergies   Allergen Reactions  . Codeine Other (See Comments)    For long periods of time--itching.     History  Substance Use Topics  . Smoking status: Former Smoker    Quit date: 12/01/2003  . Smokeless tobacco: Not on file  . Alcohol Use: Yes     Comment: rare    No family history on file.   Review of Systems  Constitutional: Positive for malaise/fatigue.  Respiratory: Negative.   Cardiovascular: Positive for orthopnea.  Gastrointestinal: Negative.   Genitourinary: Negative.   Musculoskeletal: Positive for back pain, joint pain and neck pain.  Skin: Negative.   Neurological: Negative.     Objective:  Physical Exam  Constitutional: She is oriented to person, place, and time. She appears well-developed and well-nourished. No distress.  HENT:  Head: Normocephalic and atraumatic.  Eyes: EOM are normal. Pupils are equal, round, and reactive to light.  Neck: Neck supple.  Cardiovascular: Normal rate, regular rhythm and normal heart sounds.   No murmur heard. Respiratory: Breath sounds normal.  GI: Soft. Bowel sounds are normal.  Musculoskeletal:       Right knee: She exhibits decreased range of motion (5-130). She exhibits no effusion, no LCL laxity and no MCL laxity. Tenderness found. Medial joint line tenderness noted.  Neurological: She is alert and oriented to person, place, and time.    Labs:   Estimated body  mass index is 30.22 kg/(m^2) as calculated from the following:   Height as of 11/30/13: 5\' 7"  (1.702 m).   Weight as of 11/30/13: 87.544 kg (193 lb).   Imaging Review Plain radiographs demonstrate moderate degenerative joint disease of the right knee(s).  The bone quality appears to be good for age and reported activity level.  Assessment/Plan:  End stage arthritis, right knee   The patient history, physical examination, clinical judgment of the provider and imaging studies are consistent with end stage degenerative joint disease of the right knee(s) and total  knee arthroplasty is deemed medically necessary. The treatment options including medical management, injection therapy arthroscopy and arthroplasty were discussed at length. The risks and benefits of total knee arthroplasty were presented and reviewed. The risks due to aseptic loosening, infection, stiffness, patella tracking problems, thromboembolic complications and other imponderables were discussed. The patient acknowledged the explanation, agreed to proceed with the plan and consent was signed. Patient is being admitted for inpatient treatment for surgery, pain control, PT, OT, prophylactic antibiotics, VTE prophylaxis, progressive ambulation and ADL's and discharge planning. The patient is planning to be discharged to skilled nursing facility.  [name], PA-C

## 2013-12-13 ENCOUNTER — Encounter (HOSPITAL_COMMUNITY): Payer: Self-pay | Admitting: Orthopedic Surgery

## 2013-12-13 LAB — BASIC METABOLIC PANEL
Anion gap: 13 (ref 5–15)
BUN: 10 mg/dL (ref 6–23)
CALCIUM: 9.2 mg/dL (ref 8.4–10.5)
CO2: 26 meq/L (ref 19–32)
CREATININE: 0.74 mg/dL (ref 0.50–1.10)
Chloride: 102 mEq/L (ref 96–112)
GFR calc Af Amer: >90 mL/min (ref >90)
GFR calc non Af Amer: 90 mL/min — ABNORMAL LOW (ref >90)
GLUCOSE: 163 mg/dL — AB (ref 70–99)
Potassium: 4.6 mEq/L (ref 3.7–5.3)
Sodium: 141 mEq/L (ref 137–147)

## 2013-12-13 LAB — CBC
HCT: 34 % — ABNORMAL LOW (ref 36.0–46.0)
Hemoglobin: 10.8 g/dL — ABNORMAL LOW (ref 12.0–15.0)
MCH: 28.1 pg (ref 26.0–34.0)
MCHC: 31.8 g/dL (ref 30.0–36.0)
MCV: 88.5 fL (ref 78.0–100.0)
Platelets: 352 10*3/uL (ref 150–400)
RBC: 3.84 MIL/uL — ABNORMAL LOW (ref 3.87–5.11)
RDW: 12.9 % (ref 11.5–15.5)
WBC: 14.6 10*3/uL — ABNORMAL HIGH (ref 4.0–10.5)

## 2013-12-13 NOTE — Progress Notes (Signed)
Physical Therapy Treatment Patient Details Name: Carla [name] MRN: [id number] DOB: 03/04/1950 Today's Date: 2014/01/01    History of Present Illness Pt is a 63 year old female s/p R TKA with hx of L patellar surgery due to fx and narcolepsy.    PT Comments    Pt ambulated in hallway and performed LE exercises.  Follow Up Recommendations  SNF     Equipment Recommendations  None recommended by PT    Recommendations for Other Services       Precautions / Restrictions Precautions Precautions: Knee Required Braces or Orthoses: Knee Immobilizer - Right Knee Immobilizer - Right: Discontinue once straight leg raise with < 10 degree lag    Mobility  Bed Mobility Overal bed mobility: Needs Assistance Bed Mobility: Supine to Sit     Supine to sit: Min assist     General bed mobility comments: assist for R LE  Transfers Overall transfer level: Needs assistance Equipment used: Rolling walker (2 wheeled) Transfers: Sit to/from Stand Sit to Stand: Min guard         General transfer comment: verbal cues for UE and LE positioning  Ambulation/Gait Ambulation/Gait assistance: Min guard Ambulation Distance (Feet): 140 Feet Assistive device: Rolling walker (2 wheeled) Gait Pattern/deviations: Step-to pattern;Antalgic;Decreased stance time - right     General Gait Details: verbal cues for step length, sequence, RW distance, posture   Stairs            Wheelchair Mobility    Modified Rankin (Stroke Patients Only)       Balance                                    Cognition Arousal/Alertness: Awake/alert Behavior During Therapy: WFL for tasks assessed/performed Overall Cognitive Status: Within Functional Limits for tasks assessed                      Exercises Total Joint Exercises Ankle Circles/Pumps: AROM;Both;15 reps Quad Sets: AROM;Both;15 reps Towel Squeeze: AROM;Both;15 reps Short Arc QuadSinclair Ship;Right;15 reps Heel Slides:  AAROM;Right;15 reps Hip ABduction/ADduction: AROM;Right;15 reps Straight Leg Raises: AAROM;Right;10 reps    General Comments        Pertinent Vitals/Pain Pain Assessment: 0-10 Pain Score: 2  Pain Location: R knee Pain Descriptors / Indicators: Aching;Sore Pain Intervention(s): Limited activity within patient's tolerance;Monitored during session;Repositioned;Ice applied    Home Living                      Prior Function            PT Goals (current goals can now be found in the care plan section) Progress towards PT goals: Progressing toward goals    Frequency  7X/week    PT Plan Current plan remains appropriate    Co-evaluation             End of Session   Activity Tolerance: Patient tolerated treatment well Patient left: in chair;with call bell/phone within reach     Time: 0851-0916 PT Time Calculation (min) (ACUTE ONLY): 25 min  Charges:  $Gait Training: 8-22 mins $Therapeutic Exercise: 8-22 mins                    G Codes:      Elnoria Livingston,[name] 01/01/2014, 1:23 PM Carmelia Bake, PT, DPT 01-01-14 Pager: [phone]

## 2013-12-13 NOTE — Progress Notes (Signed)
   Subjective: 1 Day Post-Op Procedure(s) (LRB): RIGHT TOTAL KNEE ARTHROPLASTY (Right) Patient reports pain as moderate.   Patient seen in rounds with Dr. Wynelle Link.  Had a tough night. Patient is well, but has had some minor complaints of pain in the knee, requiring pain medications We will start therapy today.  Plan is to go [facility] after hospital stay.  Objective: Vital signs in last 24 hours: Temp:  [97.3 F (36.3 C)-98 F (36.7 C)] 97.7 F (36.5 C) (12/08 0534) Pulse Rate:  [55-90] 77 (12/08 0534) Resp:  [13-19] 16 (12/08 0800) BP: (101-155)/(54-91) 127/60 mmHg (12/08 0534) SpO2:  [93 %-100 %] 98 % (12/08 0534) FiO2 (%):  [98 %] 98 % (12/07 1115) Weight:  [87.544 kg (193 lb)] 87.544 kg (193 lb) (12/07 1115)  Intake/Output from previous day:  Intake/Output Summary (Last 24 hours) at 12/13/13 0938 Last data filed at 12/13/13 0600  Gross per 24 hour  Intake   2375 ml  Output   3590 ml  Net  -1215 ml    Intake/Output this shift:    Labs:  Recent Labs  12/13/13 0405  HGB 10.8*    Recent Labs  12/13/13 0405  WBC 14.6*  RBC 3.84*  HCT 34.0*  PLT 352    Recent Labs  12/13/13 0405  NA 141  K 4.6  CL 102  CO2 26  BUN 10  CREATININE 0.74  GLUCOSE 163*  CALCIUM 9.2   No results for input(s): LABPT, INR in the last 72 hours.  EXAM General - Patient is Alert, Appropriate and Oriented Extremity - Neurovascular intact Sensation intact distally Dorsiflexion/Plantar flexion intact Dressing - dressing C/D/I Motor Function - intact, moving foot and toes well on exam.  Hemovac came out last night with patient moving around in bed.  Past Medical History  Diagnosis Date  . Narcolepsy   . Hypercholesteremia   . Arthritis   . Shortness of breath dyspnea     "when flat on my back"  . Numbness     hands  . Osteopenia   . History of skin cancer   . GERD (gastroesophageal reflux disease)   . Goiter   . Sleep apnea     "very mild" pt states does not  need c pap - last sleep study several yrs ago in [place]  . Depression   . Skin cancer     Assessment/Plan: 1 Day Post-Op Procedure(s) (LRB): RIGHT TOTAL KNEE ARTHROPLASTY (Right) Principal Problem:   OA (osteoarthritis) of knee  Estimated body mass index is 30.22 kg/(m^2) as calculated from the following:   Height as of this encounter: 5\' 7"  (1.702 m).   Weight as of this encounter: 87.544 kg (193 lb). Advance diet Up with therapy Plan for discharge tomorrow Discharge to SNF  DVT Prophylaxis - Xarelto Weight-Bearing as tolerated to right leg D/C O2 and Pulse OX and try on Room Air  [name], PA-C Orthopaedic Surgery 12/13/2013, 9:38 AM

## 2013-12-13 NOTE — Discharge Instructions (Addendum)
° °Dr. Frank Aluisio °Total Joint Specialist °[facility] °[address] °[place], [place] [address] °[phone] ° °TOTAL KNEE REPLACEMENT POSTOPERATIVE DIRECTIONS ° ° ° °Knee Rehabilitation, Guidelines Following Surgery  °Results after knee surgery are often greatly improved when you follow the exercise, range of motion and muscle strengthening exercises prescribed by your doctor. Safety measures are also important to protect the knee from further injury. Any time any of these exercises cause you to have increased pain or swelling in your knee joint, decrease the amount until you are comfortable again and slowly increase them. If you have problems or questions, call your caregiver or physical therapist for advice.  ° °HOME CARE INSTRUCTIONS  °Remove items at home which could result in a fall. This includes throw rugs or furniture in walking pathways.  °Continue medications as instructed at time of discharge. °You may have some home medications which will be placed on hold until you complete the course of blood thinner medication.  °You may start showering once you are discharged home but do not submerge the incision under water. Just pat the incision dry and apply a dry gauze dressing on daily. °Walk with walker as instructed.  °You may resume a sexual relationship in one month or when given the OK by  your doctor.  °· Use walker as long as suggested by your caregivers. °· Avoid periods of inactivity such as sitting longer than an hour when not asleep. This helps prevent blood clots.  °You may put full weight on your legs and walk as much as is comfortable.  °You may return to work once you are cleared by your doctor.  °Do not drive a car for 6 weeks or until released by you surgeon.  °· Do not drive while taking narcotics.  °Wear the elastic stockings for three weeks following surgery during the day but you may remove then at night. °Make sure you keep all of your appointments after your  operation with all of your doctors and caregivers. You should call the office at the above phone number and make an appointment for approximately two weeks after the date of your surgery. °Change the dressing daily and reapply a dry dressing each time. °Please pick up a stool softener and laxative for home use as long as you are requiring pain medications. °· ICE to the affected knee every three hours for 30 minutes at a time and then as needed for pain and swelling.  Continue to use ice on the knee for pain and swelling from surgery. You may notice swelling that will progress down to the foot and ankle.  This is normal after surgery.  Elevate the leg when you are not up walking on it.   °It is important for you to complete the blood thinner medication as prescribed by your doctor. °· Continue to use the breathing machine which will help keep your temperature down.  It is common for your temperature to cycle up and down following surgery, especially at night when you are not up moving around and exerting yourself.  The breathing machine keeps your lungs expanded and your temperature down. ° °RANGE OF MOTION AND STRENGTHENING EXERCISES  °Rehabilitation of the knee is important following a knee injury or an operation. After just a few days of immobilization, the muscles of the thigh which control the knee become weakened and shrink (atrophy). Knee exercises are designed to build up the tone and strength of the thigh muscles and to improve knee   motion. Often times heat used for twenty to thirty minutes before working out will loosen up your tissues and help with improving the range of motion but do not use heat for the first two weeks following surgery. These exercises can be done on a training (exercise) mat, on the floor, on a table or on a bed. Use what ever works the best and is most comfortable for you Knee exercises include:  Leg Lifts - While your knee is still immobilized in a splint or cast, you can do  straight leg raises. Lift the leg to 60 degrees, hold for 3 sec, and slowly lower the leg. Repeat 10-20 times 2-3 times daily. Perform this exercise against resistance later as your knee gets better.  Quad and Hamstring Sets - Tighten up the muscle on the front of the thigh (Quad) and hold for 5-10 sec. Repeat this 10-20 times hourly. Hamstring sets are done by pushing the foot backward against an object and holding for 5-10 sec. Repeat as with quad sets.  A rehabilitation program following serious knee injuries can speed recovery and prevent re-injury in the future due to weakened muscles. Contact your doctor or a physical therapist for more information on knee rehabilitation.   SKILLED REHAB INSTRUCTIONS: If the patient is transferred to a skilled rehab facility following release from the hospital, a list of the current medications will be sent to the facility for the patient to continue.  When discharged from the skilled rehab facility, please have the facility set up the patient's [place] prior to being released. Also, the skilled facility will be responsible for providing the patient with their medications at time of release from the facility to include their pain medication, the muscle relaxants, and their blood thinner medication. If the patient is still at the rehab facility at time of the two week follow up appointment, the skilled rehab facility will also need to assist the patient in arranging follow up appointment in our office and any transportation needs.  MAKE SURE YOU:  Understand these instructions.  Will watch your condition.  Will get help right away if you are not doing well or get worse.    Pick up stool softner and laxative for home. Do not submerge incision under water. May shower. Continue to use ice for pain and swelling from surgery.  Take Xarelto for two and a half more weeks, then discontinue Xarelto. Once the patient has completed the blood thinner  regimen, then take a Baby 81 mg Aspirin daily for three more weeks.  When discharged from the skilled rehab facility, please have the facility set up the patient's [place] prior to being released.   Also provide the patient with their medications at time of release from the facility to include their pain medication, the muscle relaxants, and their blood thinner medication.  If the patient is still at the rehab facility at time of follow up appointment, please also assist the patient in arranging follow up appointment in our office and any transportation needs.  ICE to the affected knee or hip every three hours for 30 minutes at a time and then as needed for pain and swelling.   Postoperative Constipation Protocol  Constipation - defined medically as fewer than three stools per week and severe constipation as less than one stool per week.  One of the most common issues patients have following surgery is constipation.  Even if you have a regular bowel pattern at home,  your normal regimen is likely to be disrupted due to multiple reasons following surgery.  Combination of anesthesia, postoperative narcotics, change in appetite and fluid intake all can affect your bowels.  In order to avoid complications following surgery, here are some recommendations in order to help you during your recovery period.  Colace (docusate) - Pick up an over-the-counter form of Colace or another stool softener and take twice a day as long as you are requiring postoperative pain medications.  Take with a full glass of water daily.  If you experience loose stools or diarrhea, hold the colace until you stool forms back up.  If your symptoms do not get better within 1 week or if they get worse, check with your doctor.  Dulcolax (bisacodyl) - Pick up over-the-counter and take as directed by the product packaging as needed to assist with the movement of your bowels.  Take with a full glass of water.  Use this  product as needed if not relieved by Colace only.   MiraLax (polyethylene glycol) - Pick up over-the-counter to have on hand.  MiraLax is a solution that will increase the amount of water in your bowels to assist with bowel movements.  Take as directed and can mix with a glass of water, juice, soda, coffee, or tea.  Take if you go more than two days without a movement. Do not use MiraLax more than once per day. Call your doctor if you are still constipated or irregular after using this medication for 7 days in a row.  If you continue to have problems with postoperative constipation, please contact the office for further assistance and recommendations.  If you experience "the worst abdominal pain ever" or develop nausea or vomiting, please contact the office immediatly for further recommendations for treatment.      Information on my medicine - XARELTO (Rivaroxaban)  This medication education was reviewed with me or my healthcare representative as part of my discharge preparation.  The pharmacist that spoke with me during my hospital stay was:  Absher, Julieta Bellini, RPH  Why was Xarelto prescribed for you? Xarelto was prescribed for you to reduce the risk of blood clots forming after orthopedic surgery. The medical term for these abnormal blood clots is venous thromboembolism (VTE).  What do you need to know about xarelto ? Take your Xarelto ONCE DAILY at the same time every day. You may take it either with or without food.  If you have difficulty swallowing the tablet whole, you may crush it and mix in applesauce just prior to taking your dose.  Take Xarelto exactly as prescribed by your doctor and DO NOT stop taking Xarelto without talking to the doctor who prescribed the medication.  Stopping without other VTE prevention medication to take the place of Xarelto may increase your risk of developing a clot.  After discharge, you should have regular check-up appointments with your  healthcare provider that is prescribing your Xarelto.    What do you do if you miss a dose? If you miss a dose, take it as soon as you remember on the same day then continue your regularly scheduled once daily regimen the next day. Do not take two doses of Xarelto on the same day.   Important Safety Information A possible side effect of Xarelto is bleeding. You should call your healthcare provider right away if you experience any of the following: ? Bleeding from an injury or your nose that does not stop. ? Unusual colored urine (  red or dark brown) or unusual colored stools (red or black). ? Unusual bruising for unknown reasons. ? A serious fall or if you hit your head (even if there is no bleeding).  Some medicines may interact with Xarelto and might increase your risk of bleeding while on Xarelto. To help avoid this, consult your healthcare provider or pharmacist prior to using any new prescription or non-prescription medications, including herbals, vitamins, non-steroidal anti-inflammatory drugs (NSAIDs) and supplements.  This website has more information on Xarelto: [email or web address]

## 2013-12-13 NOTE — Plan of Care (Signed)
Problem: Phase I Progression Outcomes Goal: Initial discharge plan identified Outcome: Completed/Met Date Met:  12/13/13 Goal: Other Phase I Outcomes/Goals Outcome: Not Applicable Date Met:  91/06/81  Problem: Phase II Progression Outcomes Goal: Ambulates Outcome: Completed/Met Date Met:  12/13/13 Goal: Tolerating diet Outcome: Completed/Met Date Met:  12/13/13 Goal: Discharge plan established Outcome: Completed/Met Date Met:  12/13/13 Goal: Other Phase II Outcomes/Goals Outcome: Not Applicable Date Met:  66/19/69

## 2013-12-13 NOTE — Progress Notes (Signed)
Physical Therapy Treatment Note    12/13/13 1600  PT Visit Information  Last PT Received On 12/13/13  Assistance Needed +1  History of Present Illness Pt is a 63 year old female s/p R TKA with hx of L patellar surgery due to fx and narcolepsy.  PT Time Calculation  PT Start Time (ACUTE ONLY) 1421  PT Stop Time (ACUTE ONLY) 1446  PT Time Calculation (min) (ACUTE ONLY) 25 min  Subjective Data  Subjective Pt ambulated in hallway then assisted back to bed.  Pt assisted with performing exercises again as pt very motivated.  Precautions  Precautions Knee  Required Braces or Orthoses Knee Immobilizer - Right  Knee Immobilizer - Right Discontinue once straight leg raise with < 10 degree lag  Restrictions  Other Position/Activity Restrictions WBAT  Pain Assessment  Pain Assessment 0-10  Pain Score 2  Pain Location R knee  Pain Descriptors / Indicators Aching;Sore  Pain Intervention(s) Limited activity within patient's tolerance;Monitored during session;Repositioned;Ice applied  Cognition  Arousal/Alertness Awake/alert  Behavior During Therapy WFL for tasks assessed/performed  Overall Cognitive Status Within Functional Limits for tasks assessed  Bed Mobility  Overal bed mobility Needs Assistance  Bed Mobility Supine to Sit;Sit to Supine  Supine to sit Supervision  Sit to supine Supervision  General bed mobility comments verbal cues for self assist  Transfers  Overall transfer level Needs assistance  Equipment used Rolling walker (2 wheeled)  Transfers Sit to/from Stand  Sit to Stand Min guard  General transfer comment verbal cues for UE and LE positioning  Ambulation/Gait  Ambulation/Gait assistance Min guard  Ambulation Distance (Feet) 150 Feet  Assistive device Rolling walker (2 wheeled)  Gait Pattern/deviations Step-through pattern;Antalgic;Decreased stance time - right  General Gait Details verbal cues for step length, RW distance, posture  Total Joint Exercises  Ankle  Circles/Pumps AROM;Both;15 reps  Quad Sets AROM;Both;15 reps  Towel Squeeze AROM;Both;10 reps  Short Arc [employer];Right;10 reps  Heel Slides AAROM;Right;10 reps  Hip ABduction/ADduction AAROM;10 reps;Right  Straight Leg Raises AAROM;Right;10 reps  PT - End of Session  Activity Tolerance Patient tolerated treatment well  Patient left in bed;with call bell/phone within reach  PT - Assessment/Plan  PT Plan Current plan remains appropriate  PT Frequency (ACUTE ONLY) 7X/week  Follow Up Recommendations SNF  PT equipment None recommended by PT  PT Goal Progression  Progress towards PT goals Progressing toward goals  PT General Charges  $$ ACUTE PT VISIT 1 Procedure  PT Treatments  $Gait Training 8-22 mins  $Therapeutic Exercise 8-22 mins   Carmelia Bake, PT, DPT 12/13/2013 Pager: [phone]

## 2013-12-13 NOTE — Care Management Note (Signed)
    Page 1 of 1   12/13/2013     9:07:19 AM CARE MANAGEMENT NOTE 12/13/2013  Patient:  Carla [name], Carla [name]   Account Number:  [id number]  Date Initiated:  12/13/2013  Documentation initiated by:  [facility]  Subjective/Objective Assessment:   adm: right total knee arthroplasty     Action/Plan:   discharge planning   Anticipated DC Date:  12/13/2013   Anticipated DC Plan:  [place]  CM consult      Choice offered to / List presented to:             Status of service:  Completed, signed off Medicare Important Message given?   (If response is "NO", the following Medicare IM given date fields will be blank) Date Medicare IM given:   Medicare IM given by:   Date Additional Medicare IM given:   Additional Medicare IM given by:    Discharge Disposition:  [place]  Per UR Regulation:    If discussed at Long Length of Stay Meetings, dates discussed:    Comments:  12/13/13 07:15 CM notes pt to go to SNF for rehab; CSW arranging.  No other CM needs were communicated.  Mariane Masters, BSN, CM [phone].

## 2013-12-13 NOTE — Progress Notes (Signed)
OT Cancellation Note  Patient Details Name: Carla [name] MRN: [id number] DOB: [birth year]   Cancelled Treatment:    Reason Eval/Treat Not Completed: Other (comment) Pt is Medicare/Medicaid and current D/C plan is SNF. No apparent immediate acute care OT needs, therefore will defer OT to SNF. If OT eval is needed please call Acute Rehab Dept. at [place] 12/13/2013, 3:22 PM  Lesle Chris, OTR/L [phone] 12/13/2013

## 2013-12-14 DIAGNOSIS — K219 Gastro-esophageal reflux disease without esophagitis: Secondary | ICD-10-CM | POA: Diagnosis not present

## 2013-12-14 DIAGNOSIS — R278 Other lack of coordination: Secondary | ICD-10-CM | POA: Diagnosis not present

## 2013-12-14 DIAGNOSIS — M6281 Muscle weakness (generalized): Secondary | ICD-10-CM | POA: Diagnosis not present

## 2013-12-14 DIAGNOSIS — M25661 Stiffness of right knee, not elsewhere classified: Secondary | ICD-10-CM | POA: Diagnosis not present

## 2013-12-14 DIAGNOSIS — M1711 Unilateral primary osteoarthritis, right knee: Secondary | ICD-10-CM | POA: Diagnosis not present

## 2013-12-14 DIAGNOSIS — M1990 Unspecified osteoarthritis, unspecified site: Secondary | ICD-10-CM | POA: Diagnosis not present

## 2013-12-14 DIAGNOSIS — Z96651 Presence of right artificial knee joint: Secondary | ICD-10-CM | POA: Diagnosis not present

## 2013-12-14 DIAGNOSIS — K5900 Constipation, unspecified: Secondary | ICD-10-CM | POA: Diagnosis not present

## 2013-12-14 DIAGNOSIS — Z471 Aftercare following joint replacement surgery: Secondary | ICD-10-CM | POA: Diagnosis not present

## 2013-12-14 DIAGNOSIS — F329 Major depressive disorder, single episode, unspecified: Secondary | ICD-10-CM | POA: Diagnosis not present

## 2013-12-14 DIAGNOSIS — R2681 Unsteadiness on feet: Secondary | ICD-10-CM | POA: Diagnosis not present

## 2013-12-14 DIAGNOSIS — E785 Hyperlipidemia, unspecified: Secondary | ICD-10-CM | POA: Diagnosis not present

## 2013-12-14 DIAGNOSIS — M8580 Other specified disorders of bone density and structure, unspecified site: Secondary | ICD-10-CM | POA: Diagnosis not present

## 2013-12-14 DIAGNOSIS — G47419 Narcolepsy without cataplexy: Secondary | ICD-10-CM | POA: Diagnosis not present

## 2013-12-14 DIAGNOSIS — D62 Acute posthemorrhagic anemia: Secondary | ICD-10-CM | POA: Diagnosis not present

## 2013-12-14 DIAGNOSIS — E049 Nontoxic goiter, unspecified: Secondary | ICD-10-CM | POA: Diagnosis not present

## 2013-12-14 LAB — CBC
HCT: 33.1 % — ABNORMAL LOW (ref 36.0–46.0)
HEMOGLOBIN: 10.7 g/dL — AB (ref 12.0–15.0)
MCH: 29.5 pg (ref 26.0–34.0)
MCHC: 32.3 g/dL (ref 30.0–36.0)
MCV: 91.2 fL (ref 78.0–100.0)
Platelets: 347 10*3/uL (ref 150–400)
RBC: 3.63 MIL/uL — AB (ref 3.87–5.11)
RDW: 13.1 % (ref 11.5–15.5)
WBC: 15.9 10*3/uL — ABNORMAL HIGH (ref 4.0–10.5)

## 2013-12-14 LAB — BASIC METABOLIC PANEL
ANION GAP: 10 (ref 5–15)
BUN: 15 mg/dL (ref 6–23)
CALCIUM: 9 mg/dL (ref 8.4–10.5)
CO2: 28 meq/L (ref 19–32)
Chloride: 103 mEq/L (ref 96–112)
Creatinine, Ser: 0.76 mg/dL (ref 0.50–1.10)
GFR calc Af Amer: >90 mL/min (ref >90)
GFR, EST NON AFRICAN AMERICAN: 89 mL/min — AB (ref >90)
GLUCOSE: 174 mg/dL — AB (ref 70–99)
POTASSIUM: 4.5 meq/L (ref 3.7–5.3)
Sodium: 141 mEq/L (ref 137–147)

## 2013-12-14 MED ORDER — ONDANSETRON HCL 4 MG PO TABS: 4.0000 mg | ORAL_TABLET | Freq: Four times a day (QID) | ORAL | Status: DC | PRN

## 2013-12-14 MED ORDER — POLYETHYLENE GLYCOL 3350 17 G PO PACK: 17.0000 g | PACK | Freq: Every day | ORAL | Status: DC | PRN

## 2013-12-14 MED ORDER — RIVAROXABAN 10 MG PO TABS: 10.0000 mg | ORAL_TABLET | Freq: Every day | ORAL | Status: DC

## 2013-12-14 MED ORDER — METOCLOPRAMIDE HCL 5 MG PO TABS: 5.0000 mg | ORAL_TABLET | Freq: Three times a day (TID) | ORAL | Status: DC | PRN

## 2013-12-14 MED ORDER — DSS 100 MG PO CAPS: 100.0000 mg | ORAL_CAPSULE | Freq: Two times a day (BID) | ORAL | Status: DC

## 2013-12-14 MED ORDER — BISACODYL 10 MG RE SUPP: 10.0000 mg | Freq: Every day | RECTAL | Status: DC | PRN

## 2013-12-14 MED ORDER — HYDROMORPHONE HCL 2 MG PO TABS: 2.0000 mg | ORAL_TABLET | ORAL | Status: DC | PRN

## 2013-12-14 MED ORDER — METHOCARBAMOL 500 MG PO TABS: 500.0000 mg | ORAL_TABLET | Freq: Four times a day (QID) | ORAL | Status: DC | PRN

## 2013-12-14 NOTE — Progress Notes (Signed)
   Subjective: 2 Days Post-Op Procedure(s) (LRB): RIGHT TOTAL KNEE ARTHROPLASTY (Right) Patient reports pain as mild.   Patient seen in rounds with Dr. Wynelle Link. Patient is well, and has had no acute complaints or problems Patient is ready to go to [facility]  Objective: Vital signs in last 24 hours: Temp:  [97.6 F (36.4 C)-98 F (36.7 C)] 98 F (36.7 C) (12/09 0515) Pulse Rate:  [78-107] 96 (12/09 0515) Resp:  [15-20] 20 (12/09 0515) BP: (113-139)/(62-85) 135/70 mmHg (12/09 0515) SpO2:  [97 %-100 %] 98 % (12/09 0515)  Intake/Output from previous day:  Intake/Output Summary (Last 24 hours) at 12/14/13 1020 Last data filed at 12/14/13 0515  Gross per 24 hour  Intake 1016.08 ml  Output      0 ml  Net 1016.08 ml    Labs:  Recent Labs  12/13/13 0405 12/14/13 0440  HGB 10.8* 10.7*    Recent Labs  12/13/13 0405 12/14/13 0440  WBC 14.6* 15.9*  RBC 3.84* 3.63*  HCT 34.0* 33.1*  PLT 352 347    Recent Labs  12/13/13 0405 12/14/13 0440  NA 141 141  K 4.6 4.5  CL 102 103  CO2 26 28  BUN 10 15  CREATININE 0.74 0.76  GLUCOSE 163* 174*  CALCIUM 9.2 9.0   No results for input(s): LABPT, INR in the last 72 hours.  EXAM: General - Patient is Alert, Appropriate and Oriented Extremity - Neurovascular intact Sensation intact distally Dorsiflexion/Plantar flexion intact Incision - clean, dry, no drainage, healing Motor Function - intact, moving foot and toes well on exam.   Assessment/Plan: 2 Days Post-Op Procedure(s) (LRB): RIGHT TOTAL KNEE ARTHROPLASTY (Right) Procedure(s) (LRB): RIGHT TOTAL KNEE ARTHROPLASTY (Right) Past Medical History  Diagnosis Date  . Narcolepsy   . Hypercholesteremia   . Arthritis   . Shortness of breath dyspnea     "when flat on my back"  . Numbness     hands  . Osteopenia   . History of skin cancer   . GERD (gastroesophageal reflux disease)   . Goiter   . Sleep apnea     "very mild" pt states does not need c pap - last  sleep study several yrs ago in [place]  . Depression   . Skin cancer    Principal Problem:   OA (osteoarthritis) of knee  Estimated body mass index is 30.22 kg/(m^2) as calculated from the following:   Height as of this encounter: 5\' 7"  (1.702 m).   Weight as of this encounter: 87.544 kg (193 lb). Up with therapy Discharge to SNF Diet - Cardiac diet Follow up - in 2 weeks Activity - WBAT Disposition - Skilled nursing facility Condition Upon Discharge - Good D/C Meds - See DC Summary DVT Prophylaxis - Xarelto  [name], PA-C Orthopaedic Surgery 12/14/2013, 10:20 AM

## 2013-12-14 NOTE — Progress Notes (Signed)
Clinical Social Work Department CLINICAL SOCIAL WORK PLACEMENT NOTE 12/14/2013  Patient:  Carla [name], Carla [name]  Account Number:  [id number] Admit date:  12/12/2013  Clinical Social Worker:  Werner Lean, LCSW  Date/time:  12/12/2013 01:51 PM  Clinical Social Work is seeking post-discharge placement for this patient at the following level of care:   SKILLED NURSING   (*CSW will update this form in Epic as items are completed)     Patient/family provided with [facility] Department of Clinical Social Work's list of facilities offering this level of care within the geographic area requested by the patient (or if unable, by the patient's family).  12/12/2013  Patient/family informed of their freedom to choose among providers that offer the needed level of care, that participate in Medicare, Medicaid or managed care program needed by the patient, have an available bed and are willing to accept the patient.    Patient/family informed of [facility]' ownership interest in [facility], as well as of the fact that they are under no obligation to receive care at this facility.  PASARR submitted to EDS on 12/12/2013 PASARR number received on 12/12/2013  FL2 transmitted to all facilities in geographic area requested by pt/family on  12/12/2013 FL2 transmitted to all facilities within larger geographic area on   Patient informed that his/her managed care company has contracts with or will negotiate with  certain facilities, including the following:     Patient/family informed of bed offers received:  12/12/2013 Patient chooses bed at [facility] Physician recommends and patient chooses bed at    Patient to be transferred to [facility] on  12/14/2013 Patient to be transferred to facility by [facility] Patient and family notified of transfer on 12/14/2013 Name of family member notified:  DAUGHTER  The following physician request were entered in Epic:   Additional Comments: Pt / daughter  in agreement with d/c to SNF today. PT approved transport by car. NSG reviewed d/c summary, scripts,avs. Scripts included in d/c packet. D/c packet given to pt to bring with her to SNF.  Werner Lean LCSW [phone]

## 2013-12-14 NOTE — Progress Notes (Signed)
Physical Therapy Treatment Patient Details Name: Avrie Kedzierski MRN: [id number] DOB: May 31, 1950 Today's Date: 12/14/2013    History of Present Illness Pt is a 63 year old female s/p R TKA with hx of L patellar surgery due to fx and narcolepsy.    PT Comments    Pt was given A to BR following 100 feet of gait training in the hallway this tx.  Pt performed car transfer per d/c to SNF.  Pt slightly impulsive with sit to stand; performs without cueing and without RW in front of her; VCs for safety.  Pt is hyperverbal.    Follow Up Recommendations  SNF     Equipment Recommendations  None recommended by PT    Recommendations for Other Services       Precautions / Restrictions Precautions Precautions: Knee Required Braces or Orthoses: Knee Immobilizer - Right Knee Immobilizer - Right: Discontinue once straight leg raise with < 10 degree lag Restrictions Weight Bearing Restrictions: No Other Position/Activity Restrictions: WBAT    Mobility  Bed Mobility Overal bed mobility: Needs Assistance Bed Mobility: Supine to Sit     Supine to sit: Supervision     General bed mobility comments: increased time to perform; mod difficulty with RLE  Transfers Overall transfer level: Needs assistance Equipment used: Rolling walker (2 wheeled) Transfers: Sit to/from Stand Sit to Stand: Supervision         General transfer comment: performed 3x, from EOB, commode, and from [facility] for car transfer; pt slightly impulsive; performs stand without cueing and without RW; VCs for safety  Ambulation/Gait Ambulation/Gait assistance: Min guard Ambulation Distance (Feet): 100 Feet Assistive device: Rolling walker (2 wheeled) Gait Pattern/deviations: Step-to pattern;Antalgic     General Gait Details: pt safe and steady with RW   Stairs            Wheelchair Mobility    Modified Rankin (Stroke Patients Only)       Balance                                    Cognition  Arousal/Alertness: Awake/alert Behavior During Therapy: WFL for tasks assessed/performed Overall Cognitive Status: Within Functional Limits for tasks assessed                      Exercises      General Comments        Pertinent Vitals/Pain Pain Assessment: 0-10 Pain Score: 8  Pain Location: R knee Pain Intervention(s): Limited activity within patient's tolerance;Monitored during session;Repositioned    Home Living                      Prior Function            PT Goals (current goals can now be found in the care plan section) Progress towards PT goals: Progressing toward goals    Frequency  7X/week    PT Plan Current plan remains appropriate    Co-evaluation             End of Session Equipment Utilized During Treatment: Gait belt Activity Tolerance: Patient tolerated treatment well Patient left: Other (comment) (in daughters car; pt d/c)     Time: 1328-1401 PT Time Calculation (min) (ACUTE ONLY): 33 min  Charges:                       G Codes:  Miller,Derrick, [place] 12/14/2013, 2:16 PM   Reviewed above  Rica Koyanagi  PTA [facility]  Acute  Rehab Pager      [phone]

## 2013-12-14 NOTE — Discharge Summary (Signed)
Physician Discharge Summary   Patient ID: Roma Bondar MRN: [id number] DOB/AGE: [birth year] 63 y.o.  Admit date: 12/12/2013 Discharge date: 12-14-2013  Primary Diagnosis:  Osteoarthritis Right knee(s) Admission Diagnoses:  Past Medical History  Diagnosis Date  . Narcolepsy   . Hypercholesteremia   . Arthritis   . Shortness of breath dyspnea     "when flat on my back"  . Numbness     hands  . Osteopenia   . History of skin cancer   . GERD (gastroesophageal reflux disease)   . Goiter   . Sleep apnea     "very mild" pt states does not need c pap - last sleep study several yrs ago in [place]  . Depression   . Skin cancer    Discharge Diagnoses:   Principal Problem:   OA (osteoarthritis) of knee  Estimated body mass index is 30.22 kg/(m^2) as calculated from the following:   Height as of this encounter: 5' 7"  (1.702 m).   Weight as of this encounter: 87.544 kg (193 lb).  Procedure:  Procedure(s) (LRB): RIGHT TOTAL KNEE ARTHROPLASTY (Right)   Consults: None  HPI: Yukie Bergeron is a 63 y.o. year old female with end stage OA of her right knee with progressively worsening pain and dysfunction. She has constant pain, with activity and at rest and significant functional deficits with difficulties even with ADLs. She has had extensive non-op management including analgesics, injections of cortisone, and home exercise program, but remains in significant pain with significant dysfunction.Radiographs show bone on bone arthritis medial and patellofemoral. She presents now for right Total Knee Arthroplasty.  Laboratory Data: Admission on 12/12/2013  Component Date Value Ref Range Status  . ABO/RH(D) 12/12/2013 B POS   Final  . Antibody Screen 12/12/2013 NEG   Final  . Sample Expiration 12/12/2013 12/15/2013   Final  . ABO/RH(D) 12/12/2013 B POS   Final  . WBC 12/13/2013 14.6* 4.0 - 10.5 K/uL Final  . RBC 12/13/2013 3.84* 3.87 - 5.11 MIL/uL Final  . Hemoglobin 12/13/2013 10.8* 12.0  - 15.0 g/dL Final  . HCT 12/13/2013 34.0* 36.0 - 46.0 % Final  . MCV 12/13/2013 88.5  78.0 - 100.0 fL Final  . MCH 12/13/2013 28.1  26.0 - 34.0 pg Final  . MCHC 12/13/2013 31.8  30.0 - 36.0 g/dL Final  . RDW 12/13/2013 12.9  11.5 - 15.5 % Final  . Platelets 12/13/2013 352  150 - 400 K/uL Final  . Sodium 12/13/2013 141  137 - 147 mEq/L Final  . Potassium 12/13/2013 4.6  3.7 - 5.3 mEq/L Final  . Chloride 12/13/2013 102  96 - 112 mEq/L Final  . CO2 12/13/2013 26  19 - 32 mEq/L Final  . Glucose, Bld 12/13/2013 163* 70 - 99 mg/dL Final  . BUN 12/13/2013 10  6 - 23 mg/dL Final  . Creatinine, Ser 12/13/2013 0.74  0.50 - 1.10 mg/dL Final  . Calcium 12/13/2013 9.2  8.4 - 10.5 mg/dL Final  . GFR calc non Af Amer 12/13/2013 90* >90 mL/min Final  . GFR calc Af Amer 12/13/2013 >90  >90 mL/min Final   Comment: (NOTE) The eGFR has been calculated using the CKD EPI equation. This calculation has not been validated in all clinical situations. eGFR's persistently <90 mL/min signify possible Chronic Kidney Disease.   . Anion gap 12/13/2013 13  5 - 15 Final  . WBC 12/14/2013 15.9* 4.0 - 10.5 K/uL Final  . RBC 12/14/2013 3.63* 3.87 - 5.11 MIL/uL Final  .  Hemoglobin 12/14/2013 10.7* 12.0 - 15.0 g/dL Final  . HCT 12/14/2013 33.1* 36.0 - 46.0 % Final  . MCV 12/14/2013 91.2  78.0 - 100.0 fL Final  . MCH 12/14/2013 29.5  26.0 - 34.0 pg Final  . MCHC 12/14/2013 32.3  30.0 - 36.0 g/dL Final  . RDW 12/14/2013 13.1  11.5 - 15.5 % Final  . Platelets 12/14/2013 347  150 - 400 K/uL Final  . Sodium 12/14/2013 141  137 - 147 mEq/L Final  . Potassium 12/14/2013 4.5  3.7 - 5.3 mEq/L Final  . Chloride 12/14/2013 103  96 - 112 mEq/L Final  . CO2 12/14/2013 28  19 - 32 mEq/L Final  . Glucose, Bld 12/14/2013 174* 70 - 99 mg/dL Final  . BUN 12/14/2013 15  6 - 23 mg/dL Final  . Creatinine, Ser 12/14/2013 0.76  0.50 - 1.10 mg/dL Final  . Calcium 12/14/2013 9.0  8.4 - 10.5 mg/dL Final  . GFR calc non Af Amer 12/14/2013  89* >90 mL/min Final  . GFR calc Af Amer 12/14/2013 >90  >90 mL/min Final   Comment: (NOTE) The eGFR has been calculated using the CKD EPI equation. This calculation has not been validated in all clinical situations. eGFR's persistently <90 mL/min signify possible Chronic Kidney Disease.   Georgiann Hahn gap 12/14/2013 10  5 - 15 Final  Hospital Outpatient Visit on 11/30/2013  Component Date Value Ref Range Status  . MRSA, PCR 11/30/2013 NEGATIVE  NEGATIVE Final  . Staphylococcus aureus 11/30/2013 POSITIVE* NEGATIVE Final   Comment:        The Xpert SA Assay (FDA approved for NASAL specimens in patients over 92 years of age), is one component of a comprehensive surveillance program.  Test performance has been validated by [employer] for patients greater than or equal to 69 year old. It is not intended to diagnose infection nor to guide or monitor treatment.   Marland Kitchen aPTT 11/30/2013 29  24 - 37 seconds Final  . WBC 11/30/2013 6.2  4.0 - 10.5 K/uL Final  . RBC 11/30/2013 4.77  3.87 - 5.11 MIL/uL Final  . Hemoglobin 11/30/2013 13.6  12.0 - 15.0 g/dL Final  . HCT 11/30/2013 41.8  36.0 - 46.0 % Final  . MCV 11/30/2013 87.6  78.0 - 100.0 fL Final  . MCH 11/30/2013 28.5  26.0 - 34.0 pg Final  . MCHC 11/30/2013 32.5  30.0 - 36.0 g/dL Final  . RDW 11/30/2013 12.7  11.5 - 15.5 % Final  . Platelets 11/30/2013 356  150 - 400 K/uL Final  . Sodium 11/30/2013 142  137 - 147 mEq/L Final  . Potassium 11/30/2013 4.2  3.7 - 5.3 mEq/L Final  . Chloride 11/30/2013 102  96 - 112 mEq/L Final  . CO2 11/30/2013 26  19 - 32 mEq/L Final  . Glucose, Bld 11/30/2013 84  70 - 99 mg/dL Final  . BUN 11/30/2013 16  6 - 23 mg/dL Final  . Creatinine, Ser 11/30/2013 0.76  0.50 - 1.10 mg/dL Final  . Calcium 11/30/2013 10.0  8.4 - 10.5 mg/dL Final  . Total Protein 11/30/2013 7.4  6.0 - 8.3 g/dL Final  . Albumin 11/30/2013 4.0  3.5 - 5.2 g/dL Final  . AST 11/30/2013 21  0 - 37 U/L Final  . ALT 11/30/2013 17  0 - 35 U/L  Final  . Alkaline Phosphatase 11/30/2013 100  39 - 117 U/L Final  . Total Bilirubin 11/30/2013 0.4  0.3 - 1.2 mg/dL Final  .  GFR calc non Af Amer 11/30/2013 89* >90 mL/min Final  . GFR calc Af Amer 11/30/2013 >90  >90 mL/min Final   Comment: (NOTE) The eGFR has been calculated using the CKD EPI equation. This calculation has not been validated in all clinical situations. eGFR's persistently <90 mL/min signify possible Chronic Kidney Disease.   . Anion gap 11/30/2013 14  5 - 15 Final  . Prothrombin Time 11/30/2013 13.2  11.6 - 15.2 seconds Final  . INR 11/30/2013 0.99  0.00 - 1.49 Final  . Color, Urine 11/30/2013 YELLOW  YELLOW Final  . APPearance 11/30/2013 CLEAR  CLEAR Final  . Specific Gravity, Urine 11/30/2013 1.028  1.005 - 1.030 Final  . pH 11/30/2013 5.0  5.0 - 8.0 Final  . Glucose, UA 11/30/2013 NEGATIVE  NEGATIVE mg/dL Final  . Hgb urine dipstick 11/30/2013 NEGATIVE  NEGATIVE Final  . Bilirubin Urine 11/30/2013 NEGATIVE  NEGATIVE Final  . Ketones, ur 11/30/2013 NEGATIVE  NEGATIVE mg/dL Final  . Protein, ur 11/30/2013 NEGATIVE  NEGATIVE mg/dL Final  . Urobilinogen, UA 11/30/2013 0.2  0.0 - 1.0 mg/dL Final  . Nitrite 11/30/2013 NEGATIVE  NEGATIVE Final  . Leukocytes, UA 11/30/2013 SMALL* NEGATIVE Final  . Squamous Epithelial / LPF 11/30/2013 RARE  RARE Final  . WBC, UA 11/30/2013 3-6  <3 WBC/hpf Final     X-Rays:Dg Chest 2 View  12/12/2013   CLINICAL DATA:  Right knee osteoarthritis. Preop respiratory exam for right knee arthroplasty.  EXAM: CHEST  2 VIEW  COMPARISON:  07/24/2011  FINDINGS: The heart size and mediastinal contours are within normal limits. Both lungs are clear. The visualized skeletal structures are unremarkable.  IMPRESSION: No active cardiopulmonary disease.   Electronically Signed   By: Earle Gell M.D.   On: 12/12/2013 07:10    EKG: Orders placed or performed during the hospital encounter of 07/24/11  . ED EKG  . ED EKG  . EKG 12-Lead  . EKG 12-Lead    . EKG     Hospital Course: Aamari West is a 63 y.o. who was admitted to [facility]. They were brought to the operating room on 12/12/2013 and underwent Procedure(s): RIGHT TOTAL KNEE ARTHROPLASTY.  Patient tolerated the procedure well and was later transferred to the recovery room and then to the orthopaedic floor for postoperative care.  They were given PO and IV analgesics for pain control following their surgery.  They were given 24 hours of postoperative antibiotics of  Anti-infectives    Start     Dose/Rate Route Frequency Ordered Stop   12/12/13 1400  ceFAZolin (ANCEF) IVPB 2 g/50 mL premix     2 g100 mL/hr over 30 Minutes Intravenous Every 6 hours 12/12/13 1137 12/12/13 2006   12/12/13 0616  ceFAZolin (ANCEF) IVPB 2 g/50 mL premix     2 g100 mL/hr over 30 Minutes Intravenous On call to O.R. 12/12/13 1761 12/12/13 0805     and started on DVT prophylaxis in the form of Xarelto.   PT and OT were ordered for total joint protocol.  Discharge planning consulted to help with postop disposition and equipment needs.  Patient had a tough night on the evening of surgery.  They started to get up OOB with therapy on day one. Hemovac drain was pulled without difficulty.  Continued to work with therapy into day two walking over 100 feet.  Dressing was changed on day two and the incision was healing well. Patient was seen in rounds and was ready to go  to [facility].  Discharge to SNF Diet - Cardiac diet Follow up - in 2 weeks Activity - WBAT Disposition - Skilled nursing facility Condition Upon Discharge - Good D/C Meds - See DC Summary DVT Prophylaxis - Xarelto      Discharge Instructions    Call MD / Call 911    Complete by:  As directed   If you experience chest pain or shortness of breath, CALL 911 and be transported to the hospital emergency room.  If you develope a fever above 101 F, pus (white drainage) or increased drainage or redness at the wound, or calf pain, call your  surgeon's office.     Change dressing    Complete by:  As directed   Change dressing daily with sterile 4 x 4 inch gauze dressing and apply TED hose. Do not submerge the incision under water.     Constipation Prevention    Complete by:  As directed   Drink plenty of fluids.  Prune juice may be helpful.  You may use a stool softener, such as Colace (over the counter) 100 mg twice a day.  Use MiraLax (over the counter) for constipation as needed.     Diet Carb Modified    Complete by:  As directed      Discharge instructions    Complete by:  As directed   Pick up stool softner and laxative for home. Do not submerge incision under water. May shower. Continue to use ice for pain and swelling from surgery.  Take Xarelto for two and a half more weeks, then discontinue Xarelto. Once the patient has completed the blood thinner regimen, then take a Baby 81 mg Aspirin daily for three more weeks.  When discharged from the skilled rehab facility, please have the facility set up the patient's [place] prior to being released.   Also provide the patient with their medications at time of release from the facility to include their pain medication, the muscle relaxants, and their blood thinner medication.  If the patient is still at the rehab facility at time of follow up appointment, please also assist the patient in arranging follow up appointment in our office and any transportation needs. ICE to the affected knee or hip every three hours for 30 minutes at a time and then as needed for pain and swelling.   Postoperative Constipation Protocol  Constipation - defined medically as fewer than three stools per week and severe constipation as less than one stool per week.  One of the most common issues patients have following surgery is constipation.  Even if you have a regular bowel pattern at home, your normal regimen is likely to be disrupted due to multiple reasons following surgery.   Combination of anesthesia, postoperative narcotics, change in appetite and fluid intake all can affect your bowels.  In order to avoid complications following surgery, here are some recommendations in order to help you during your recovery period.  Colace (docusate) - Pick up an over-the-counter form of Colace or another stool softener and take twice a day as long as you are requiring postoperative pain medications.  Take with a full glass of water daily.  If you experience loose stools or diarrhea, hold the colace until you stool forms back up.  If your symptoms do not get better within 1 week or if they get worse, check with your doctor.  Dulcolax (bisacodyl) - Pick up over-the-counter and take as directed by the product packaging  as needed to assist with the movement of your bowels.  Take with a full glass of water.  Use this product as needed if not relieved by Colace only.   MiraLax (polyethylene glycol) - Pick up over-the-counter to have on hand.  MiraLax is a solution that will increase the amount of water in your bowels to assist with bowel movements.  Take as directed and can mix with a glass of water, juice, soda, coffee, or tea.  Take if you go more than two days without a movement. Do not use MiraLax more than once per day. Call your doctor if you are still constipated or irregular after using this medication for 7 days in a row.  If you continue to have problems with postoperative constipation, please contact the office for further assistance and recommendations.  If you experience "the worst abdominal pain ever" or develop nausea or vomiting, please contact the office immediatly for further recommendations for treatment.     Do not put a pillow under the knee. Place it under the heel.    Complete by:  As directed      Do not sit on low chairs, stoools or toilet seats, as it may be difficult to get up from low surfaces    Complete by:  As directed      Driving restrictions    Complete by:   As directed   No driving until released by the physician.     Increase activity slowly as tolerated    Complete by:  As directed      Lifting restrictions    Complete by:  As directed   No lifting until released by the physician.     Patient may shower    Complete by:  As directed   You may shower without a dressing once there is no drainage.  Do not wash over the wound.  If drainage remains, do not shower until drainage stops.     TED hose    Complete by:  As directed   Use stockings (TED hose) for 3 weeks on both leg(s).  You may remove them at night for sleeping.     Weight bearing as tolerated    Complete by:  As directed             Medication List    STOP taking these medications        celecoxib 200 MG capsule  Commonly known as:  CELEBREX     HYDROcodone-acetaminophen 5-325 MG per tablet  Commonly known as:  NORCO/VICODIN     OVER THE COUNTER MEDICATION     SUPER B COMPLEX PO      TAKE these medications        acetaminophen 500 MG tablet  Commonly known as:  TYLENOL  Take 500-1,000 mg by mouth every 8 (eight) hours as needed for moderate pain.     amphetamine-dextroamphetamine 20 MG tablet  Commonly known as:  ADDERALL  Take 20 mg by mouth 4 (four) times daily.     atorvastatin 40 MG tablet  Commonly known as:  LIPITOR  Take 40 mg by mouth at bedtime.     bisacodyl 10 MG suppository  Commonly known as:  DULCOLAX  Place 1 suppository (10 mg total) rectally daily as needed for moderate constipation.     CLEAR EYES COMPLETE OP  Apply 1-2 drops to eye daily as needed (dry eyes.).     DSS 100 MG Caps  Take 100 mg by  mouth 2 (two) times daily.     DULoxetine 30 MG capsule  Commonly known as:  CYMBALTA  Take 30 mg by mouth every evening.     HYDROmorphone 2 MG tablet  Commonly known as:  DILAUDID  Take 1-2 tablets (2-4 mg total) by mouth every 4 (four) hours as needed for moderate pain or severe pain.     methocarbamol 500 MG tablet  Commonly  known as:  ROBAXIN  Take 1 tablet (500 mg total) by mouth every 6 (six) hours as needed for muscle spasms.     metoCLOPramide 5 MG tablet  Commonly known as:  REGLAN  Take 1 tablet (5 mg total) by mouth every 8 (eight) hours as needed for nausea (if ondansetron (ZOFRAN) ineffective.).     modafinil 200 MG tablet  Commonly known as:  PROVIGIL  Take 200 mg by mouth daily.     ondansetron 4 MG tablet  Commonly known as:  ZOFRAN  Take 1 tablet (4 mg total) by mouth every 6 (six) hours as needed for nausea.     pantoprazole 40 MG tablet  Commonly known as:  PROTONIX  Take 40 mg by mouth at bedtime.     polyethylene glycol packet  Commonly known as:  MIRALAX / GLYCOLAX  Take 17 g by mouth daily as needed for mild constipation.     rivaroxaban 10 MG Tabs tablet  Commonly known as:  XARELTO  - Take 1 tablet (10 mg total) by mouth daily with breakfast. Take Xarelto for two and a half more weeks, then discontinue Xarelto.  - Once the patient has completed the blood thinner regimen, then take a Baby 81 mg Aspirin daily for three more weeks.       Follow-up Information    Follow up with [name], MD. Schedule an appointment as soon as possible for a visit on 12/22/2013.   Specialty:  Orthopedic Surgery   Why:  Call [phone] tomorrow to make the appointment on Thursday the 17th with Dr. Wynelle Link.   Contact information:   [address] [place] [address] [phone]       Signed: [name], PA-C Orthopaedic Surgery 12/14/2013, 10:27 AM

## 2013-12-15 ENCOUNTER — Encounter: Payer: Self-pay | Admitting: Adult Health

## 2013-12-15 ENCOUNTER — Non-Acute Institutional Stay: Payer: Medicare Other | Admitting: Adult Health

## 2013-12-15 DIAGNOSIS — K219 Gastro-esophageal reflux disease without esophagitis: Secondary | ICD-10-CM | POA: Diagnosis not present

## 2013-12-15 DIAGNOSIS — K5900 Constipation, unspecified: Secondary | ICD-10-CM

## 2013-12-15 DIAGNOSIS — M1711 Unilateral primary osteoarthritis, right knee: Secondary | ICD-10-CM

## 2013-12-15 DIAGNOSIS — D62 Acute posthemorrhagic anemia: Secondary | ICD-10-CM

## 2013-12-15 DIAGNOSIS — E785 Hyperlipidemia, unspecified: Secondary | ICD-10-CM | POA: Diagnosis not present

## 2013-12-15 DIAGNOSIS — F32A Depression, unspecified: Secondary | ICD-10-CM

## 2013-12-15 DIAGNOSIS — G47419 Narcolepsy without cataplexy: Secondary | ICD-10-CM

## 2013-12-15 DIAGNOSIS — F329 Major depressive disorder, single episode, unspecified: Secondary | ICD-10-CM

## 2013-12-15 NOTE — Progress Notes (Signed)
Patient ID: Carla [name], female   DOB: Dec 06, 1950, 63 y.o.   MRN: [id number]   12/15/2013  Facility:  Nursing Home Location:  [place] Room Number: 1202-P LEVEL OF CARE:  SNF (31)   Chief Complaint  Patient presents with  . Hospitalization Follow-up    Osteoarthritis S/P right total knee arthroplasty, narcolepsy, hyperlipidemia, as the patient's, depression and GERD    HISTORY OF PRESENT ILLNESS:   This is a 63 year old female who has been admitted to [facility] on 12/14/13 from [facility] with osteoarthritis S/P right total knee arthroplasty. She has been admitted for a short-term rehabilitation.  REASSESSMENT OF ONGOING PROBLEMS:  DEPRESSION: The depression remains stable. Patient denies ongoing feelings of sadness, insomnia, anedhonia or lack of appetite. No complications reported from the medications currently being used. Staff do not report behavioral problems.  GERD: pt's GERD is stable.  Denies ongoing heartburn, abd. Pain, nausea or vomiting.  Currently on a PPI & tolerates it without any adverse reactions.  CONSTIPATION: The constipation remains stable. No complications from the medications presently being used. Patient denies ongoing constipation, abdominal pain, nausea or vomiting.   PAST MEDICAL HISTORY:  Past Medical History  Diagnosis Date  . Narcolepsy   . Hypercholesteremia   . Arthritis   . Shortness of breath dyspnea     "when flat on my back"  . Numbness     hands  . Osteopenia   . History of skin cancer   . GERD (gastroesophageal reflux disease)   . Goiter   . Sleep apnea     "very mild" pt states does not need c pap - last sleep study several yrs ago in [place]  . Depression   . Skin cancer     CURRENT MEDICATIONS: Reviewed per MAR/see medication list  Allergies  Allergen Reactions  . Codeine Other (See Comments)    For long periods of time--itching.      REVIEW OF SYSTEMS:  GENERAL: no  change in appetite, no fatigue, no weight changes, no fever, chills or weakness RESPIRATORY: no cough, SOB, DOE, wheezing, hemoptysis CARDIAC: no chest pain, edema or palpitations GI: no abdominal pain, diarrhea, constipation, heart burn, nausea or vomiting  PHYSICAL EXAMINATION  GENERAL: no acute distress, obese SKIN:  Right knee surgical site is dry, no erythema, has Steri-Strips intact EYES: conjunctivae normal, sclerae normal, normal eye lids NECK: supple, trachea midline, no neck masses, no thyroid tenderness, no thyromegaly LYMPHATICS: no LAN in the neck, no supraclavicular LAN RESPIRATORY: breathing is even & unlabored, BS CTAB CARDIAC: RRR, no murmur,no extra heart sounds, no edema GI: abdomen soft, normal BS, no masses, no tenderness, no hepatomegaly, no splenomegaly EXTREMITIES: Able to move all 4 extremities PSYCHIATRIC: the patient is alert & oriented to person, affect & behavior appropriate  LABS/RADIOLOGY: Labs reviewed: Basic Metabolic Panel:  Recent Labs  11/30/13 1535 12/13/13 0405 12/14/13 0440  NA 142 141 141  K 4.2 4.6 4.5  CL 102 102 103  CO2 26 26 28   GLUCOSE 84 163* 174*  BUN 16 10 15   CREATININE 0.76 0.74 0.76  CALCIUM 10.0 9.2 9.0   Liver Function Tests:  Recent Labs  11/30/13 1535  AST 21  ALT 17  ALKPHOS 100  BILITOT 0.4  PROT 7.4  ALBUMIN 4.0   CBC:  Recent Labs  11/30/13 1535 12/13/13 0405 12/14/13 0440  WBC 6.2 14.6* 15.9*  HGB 13.6 10.8* 10.7*  HCT 41.8 34.0* 33.1*  MCV  87.6 88.5 91.2  PLT 356 352 347     Dg Chest 2 View  12/12/2013   CLINICAL DATA:  Right knee osteoarthritis. Preop respiratory exam for right knee arthroplasty.  EXAM: CHEST  2 VIEW  COMPARISON:  07/24/2011  FINDINGS: The heart size and mediastinal contours are within normal limits. Both lungs are clear. The visualized skeletal structures are unremarkable.  IMPRESSION: No active cardiopulmonary disease.   Electronically Signed   By: Earle Gell M.D.   On:  12/12/2013 07:10    ASSESSMENT/PLAN:  Osteoarthritis S/P right total knee arthroplasty - for rehabilitation; continue Xarelto 10 mg by mouth daily 18 days then aspirin 81 mg by mouth daily 3 weeks for DVT prophylaxis; continue Dilaudid 2 mg 1-2 tabs by mouth every 4 hours when necessary and Robaxin 500 mg by mouth every 6 hours when necessary for pain management Anemia, acute blood loss - stable; hemoglobin 10.7 Narcolepsy - continue Adderall 20 mg by mouth 4 times a day and Provigil 200 mg by mouth daily Hyperlipidemia - continue atorvastatin 40 mg by mouth daily at bedtime Constipation - stable; continue Colace 100 mg by mouth twice a day Depression - mood is stable; continue Cymbalta 30 mg by mouth every afternoon GERD - stable; continue Protonix 40 mg by mouth daily at bedtime   Spent 50 minutes in patient care.     [facility], NP [employer] [phone]

## 2013-12-20 ENCOUNTER — Encounter: Payer: Self-pay | Admitting: Adult Health

## 2013-12-20 ENCOUNTER — Non-Acute Institutional Stay (SKILLED_NURSING_FACILITY): Payer: Medicare Other | Admitting: Adult Health

## 2013-12-20 ENCOUNTER — Other Ambulatory Visit: Payer: Self-pay | Admitting: *Deleted

## 2013-12-20 DIAGNOSIS — G47419 Narcolepsy without cataplexy: Secondary | ICD-10-CM

## 2013-12-20 DIAGNOSIS — D62 Acute posthemorrhagic anemia: Secondary | ICD-10-CM | POA: Diagnosis not present

## 2013-12-20 DIAGNOSIS — E785 Hyperlipidemia, unspecified: Secondary | ICD-10-CM

## 2013-12-20 DIAGNOSIS — F329 Major depressive disorder, single episode, unspecified: Secondary | ICD-10-CM | POA: Diagnosis not present

## 2013-12-20 DIAGNOSIS — K5900 Constipation, unspecified: Secondary | ICD-10-CM | POA: Diagnosis not present

## 2013-12-20 DIAGNOSIS — K219 Gastro-esophageal reflux disease without esophagitis: Secondary | ICD-10-CM | POA: Diagnosis not present

## 2013-12-20 DIAGNOSIS — M1711 Unilateral primary osteoarthritis, right knee: Secondary | ICD-10-CM

## 2013-12-20 DIAGNOSIS — F32A Depression, unspecified: Secondary | ICD-10-CM

## 2013-12-20 MED ORDER — MODAFINIL 200 MG PO TABS: ORAL_TABLET | ORAL | Status: DC

## 2013-12-20 NOTE — Telephone Encounter (Signed)
[facility] 

## 2013-12-20 NOTE — Progress Notes (Signed)
Patient ID: Carla [name], female   DOB: 1950-08-20, 63 y.o.   MRN: [id number]   12/20/2013  Facility:  Nursing Home Location:  [place] Room Number: 1202-P LEVEL OF CARE:  SNF (31)   Chief Complaint  Patient presents with  . Discharge Note    Osteoarthritis S/P right total knee arthroplasty, narcolepsy, hyperlipidemia, constipation, anemia, depression and GERD    HISTORY OF PRESENT ILLNESS:   This is a 63 year old female who is for discharge home with home health PT. She has been admitted to [facility] on 12/14/13 from [facility] with osteoarthritis S/P right total knee arthroplasty. Patient was admitted to this facility for short-term rehabilitation after the patient's recent hospitalization.  Patient has completed SNF rehabilitation and therapy has cleared the patient for discharge.   REASSESSMENT OF ONGOING PROBLEMS:  DEPRESSION: The depression remains stable. Patient denies ongoing feelings of sadness, insomnia, anedhonia or lack of appetite. No complications reported from the medications currently being used. Staff do not report behavioral problems.  GERD: pt's GERD is stable.  Denies ongoing heartburn, abd. Pain, nausea or vomiting.  Currently on a PPI & tolerates it without any adverse reactions.  CONSTIPATION: The constipation remains stable. No complications from the medications presently being used. Patient denies ongoing constipation, abdominal pain, nausea or vomiting.   PAST MEDICAL HISTORY:  Past Medical History  Diagnosis Date  . Narcolepsy   . Hypercholesteremia   . Arthritis   . Shortness of breath dyspnea     "when flat on my back"  . Numbness     hands  . Osteopenia   . History of skin cancer   . GERD (gastroesophageal reflux disease)   . Goiter   . Sleep apnea     "very mild" pt states does not need c pap - last sleep study several yrs ago in [place]  . Depression   . Skin cancer     CURRENT MEDICATIONS:  Reviewed per MAR/see medication list  Allergies  Allergen Reactions  . Codeine Other (See Comments)    For long periods of time--itching.      REVIEW OF SYSTEMS:  GENERAL: no change in appetite, no fatigue, no weight changes, no fever, chills or weakness RESPIRATORY: no cough, SOB, DOE, wheezing, hemoptysis CARDIAC: no chest pain, edema or palpitations GI: no abdominal pain, diarrhea, constipation, heart burn, nausea or vomiting  PHYSICAL EXAMINATION  GENERAL: no acute distress, obese SKIN:  Right knee surgical site is dry, no erythema, has Steri-Strips intact NECK: supple, trachea midline, no neck masses, no thyroid tenderness, no thyromegaly LYMPHATICS: no LAN in the neck, no supraclavicular LAN RESPIRATORY: breathing is even & unlabored, BS CTAB CARDIAC: RRR, no murmur,no extra heart sounds, no edema GI: abdomen soft, normal BS, no masses, no tenderness, no hepatomegaly, no splenomegaly EXTREMITIES: Able to move all 4 extremities PSYCHIATRIC: the patient is alert & oriented to person, affect & behavior appropriate  LABS/RADIOLOGY: Labs reviewed: Basic Metabolic Panel:  Recent Labs  11/30/13 1535 12/13/13 0405 12/14/13 0440  NA 142 141 141  K 4.2 4.6 4.5  CL 102 102 103  CO2 26 26 28   GLUCOSE 84 163* 174*  BUN 16 10 15   CREATININE 0.76 0.74 0.76  CALCIUM 10.0 9.2 9.0   Liver Function Tests:  Recent Labs  11/30/13 1535  AST 21  ALT 17  ALKPHOS 100  BILITOT 0.4  PROT 7.4  ALBUMIN 4.0   CBC:  Recent Labs  11/30/13 1535  12/13/13 0405 12/14/13 0440  WBC 6.2 14.6* 15.9*  HGB 13.6 10.8* 10.7*  HCT 41.8 34.0* 33.1*  MCV 87.6 88.5 91.2  PLT 356 352 347     Dg Chest 2 View  12/12/2013   CLINICAL DATA:  Right knee osteoarthritis. Preop respiratory exam for right knee arthroplasty.  EXAM: CHEST  2 VIEW  COMPARISON:  07/24/2011  FINDINGS: The heart size and mediastinal contours are within normal limits. Both lungs are clear. The visualized skeletal  structures are unremarkable.  IMPRESSION: No active cardiopulmonary disease.   Electronically Signed   By: Earle Gell M.D.   On: 12/12/2013 07:10    ASSESSMENT/PLAN:  Osteoarthritis S/P right total knee arthroplasty - for home health PT; continue Xarelto 10 mg by mouth daily 11 days then aspirin 81 mg by mouth daily 3 weeks for DVT prophylaxis; continue Dilaudid 2 mg 1-2 tabs by mouth every 4 hours when necessary and Robaxin 500 mg by mouth every 6 hours when necessary for pain management Anemia, acute blood loss - stable; hemoglobin 10.7 Narcolepsy - continue Adderall 20 mg by mouth 4 times a day and Provigil 200 mg by mouth daily Hyperlipidemia - continue atorvastatin 40 mg by mouth daily at bedtime Constipation - stable; continue Colace 100 mg by mouth twice a day Depression - mood is stable; continue Cymbalta 30 mg by mouth every afternoon GERD - stable; continue Protonix 40 mg by mouth daily at bedtime    I have filled out patient's discharge paperwork and written prescriptions.  Patient will receive home health PT.  Total discharge time: Less than 30 minutes  Discharge time involved coordination of the discharge process with Education officer, museum, nursing staff and therapy department. Medical justification for home health services verified.    [facility], NP [employer] [phone]

## 2013-12-22 DIAGNOSIS — Z471 Aftercare following joint replacement surgery: Secondary | ICD-10-CM | POA: Diagnosis not present

## 2013-12-22 DIAGNOSIS — Z96651 Presence of right artificial knee joint: Secondary | ICD-10-CM | POA: Diagnosis not present

## 2013-12-22 DIAGNOSIS — R269 Unspecified abnormalities of gait and mobility: Secondary | ICD-10-CM | POA: Diagnosis not present

## 2013-12-23 DIAGNOSIS — R269 Unspecified abnormalities of gait and mobility: Secondary | ICD-10-CM | POA: Diagnosis not present

## 2013-12-23 DIAGNOSIS — Z471 Aftercare following joint replacement surgery: Secondary | ICD-10-CM | POA: Diagnosis not present

## 2013-12-23 DIAGNOSIS — Z96651 Presence of right artificial knee joint: Secondary | ICD-10-CM | POA: Diagnosis not present

## 2013-12-24 DIAGNOSIS — Z96651 Presence of right artificial knee joint: Secondary | ICD-10-CM | POA: Diagnosis not present

## 2013-12-24 DIAGNOSIS — Z471 Aftercare following joint replacement surgery: Secondary | ICD-10-CM | POA: Diagnosis not present

## 2013-12-24 DIAGNOSIS — R269 Unspecified abnormalities of gait and mobility: Secondary | ICD-10-CM | POA: Diagnosis not present

## 2013-12-26 DIAGNOSIS — R269 Unspecified abnormalities of gait and mobility: Secondary | ICD-10-CM | POA: Diagnosis not present

## 2013-12-26 DIAGNOSIS — Z96651 Presence of right artificial knee joint: Secondary | ICD-10-CM | POA: Diagnosis not present

## 2013-12-26 DIAGNOSIS — Z471 Aftercare following joint replacement surgery: Secondary | ICD-10-CM | POA: Diagnosis not present

## 2013-12-27 DIAGNOSIS — Z471 Aftercare following joint replacement surgery: Secondary | ICD-10-CM | POA: Diagnosis not present

## 2013-12-27 DIAGNOSIS — R269 Unspecified abnormalities of gait and mobility: Secondary | ICD-10-CM | POA: Diagnosis not present

## 2013-12-27 DIAGNOSIS — Z96651 Presence of right artificial knee joint: Secondary | ICD-10-CM | POA: Diagnosis not present

## 2013-12-28 DIAGNOSIS — Z96651 Presence of right artificial knee joint: Secondary | ICD-10-CM | POA: Diagnosis not present

## 2013-12-28 DIAGNOSIS — Z471 Aftercare following joint replacement surgery: Secondary | ICD-10-CM | POA: Diagnosis not present

## 2013-12-28 DIAGNOSIS — R269 Unspecified abnormalities of gait and mobility: Secondary | ICD-10-CM | POA: Diagnosis not present

## 2013-12-29 DIAGNOSIS — Z471 Aftercare following joint replacement surgery: Secondary | ICD-10-CM | POA: Diagnosis not present

## 2013-12-29 DIAGNOSIS — R269 Unspecified abnormalities of gait and mobility: Secondary | ICD-10-CM | POA: Diagnosis not present

## 2013-12-29 DIAGNOSIS — Z96651 Presence of right artificial knee joint: Secondary | ICD-10-CM | POA: Diagnosis not present

## 2014-01-02 DIAGNOSIS — Z96651 Presence of right artificial knee joint: Secondary | ICD-10-CM | POA: Diagnosis not present

## 2014-01-02 DIAGNOSIS — R269 Unspecified abnormalities of gait and mobility: Secondary | ICD-10-CM | POA: Diagnosis not present

## 2014-01-02 DIAGNOSIS — Z471 Aftercare following joint replacement surgery: Secondary | ICD-10-CM | POA: Diagnosis not present

## 2014-01-03 DIAGNOSIS — Z471 Aftercare following joint replacement surgery: Secondary | ICD-10-CM | POA: Diagnosis not present

## 2014-01-03 DIAGNOSIS — R269 Unspecified abnormalities of gait and mobility: Secondary | ICD-10-CM | POA: Diagnosis not present

## 2014-01-03 DIAGNOSIS — Z96651 Presence of right artificial knee joint: Secondary | ICD-10-CM | POA: Diagnosis not present

## 2014-01-10 DIAGNOSIS — R269 Unspecified abnormalities of gait and mobility: Secondary | ICD-10-CM | POA: Diagnosis not present

## 2014-01-10 DIAGNOSIS — Z96651 Presence of right artificial knee joint: Secondary | ICD-10-CM | POA: Diagnosis not present

## 2014-01-10 DIAGNOSIS — Z471 Aftercare following joint replacement surgery: Secondary | ICD-10-CM | POA: Diagnosis not present

## 2014-01-11 DIAGNOSIS — R269 Unspecified abnormalities of gait and mobility: Secondary | ICD-10-CM | POA: Diagnosis not present

## 2014-01-11 DIAGNOSIS — Z471 Aftercare following joint replacement surgery: Secondary | ICD-10-CM | POA: Diagnosis not present

## 2014-01-11 DIAGNOSIS — Z96651 Presence of right artificial knee joint: Secondary | ICD-10-CM | POA: Diagnosis not present

## 2014-01-13 DIAGNOSIS — Z471 Aftercare following joint replacement surgery: Secondary | ICD-10-CM | POA: Diagnosis not present

## 2014-01-13 DIAGNOSIS — Z96651 Presence of right artificial knee joint: Secondary | ICD-10-CM | POA: Diagnosis not present

## 2014-01-13 DIAGNOSIS — R269 Unspecified abnormalities of gait and mobility: Secondary | ICD-10-CM | POA: Diagnosis not present

## 2014-01-16 DIAGNOSIS — Z96651 Presence of right artificial knee joint: Secondary | ICD-10-CM | POA: Diagnosis not present

## 2014-01-16 DIAGNOSIS — Z471 Aftercare following joint replacement surgery: Secondary | ICD-10-CM | POA: Diagnosis not present

## 2014-01-16 DIAGNOSIS — R269 Unspecified abnormalities of gait and mobility: Secondary | ICD-10-CM | POA: Diagnosis not present

## 2014-01-17 DIAGNOSIS — Z471 Aftercare following joint replacement surgery: Secondary | ICD-10-CM | POA: Diagnosis not present

## 2014-01-17 DIAGNOSIS — Z96651 Presence of right artificial knee joint: Secondary | ICD-10-CM | POA: Diagnosis not present

## 2014-01-20 DIAGNOSIS — R269 Unspecified abnormalities of gait and mobility: Secondary | ICD-10-CM | POA: Diagnosis not present

## 2014-01-20 DIAGNOSIS — Z471 Aftercare following joint replacement surgery: Secondary | ICD-10-CM | POA: Diagnosis not present

## 2014-01-20 DIAGNOSIS — Z96651 Presence of right artificial knee joint: Secondary | ICD-10-CM | POA: Diagnosis not present

## 2014-01-24 DIAGNOSIS — Z471 Aftercare following joint replacement surgery: Secondary | ICD-10-CM | POA: Diagnosis not present

## 2014-01-24 DIAGNOSIS — R269 Unspecified abnormalities of gait and mobility: Secondary | ICD-10-CM | POA: Diagnosis not present

## 2014-01-24 DIAGNOSIS — Z96651 Presence of right artificial knee joint: Secondary | ICD-10-CM | POA: Diagnosis not present

## 2014-01-25 DIAGNOSIS — Z471 Aftercare following joint replacement surgery: Secondary | ICD-10-CM | POA: Diagnosis not present

## 2014-01-25 DIAGNOSIS — R269 Unspecified abnormalities of gait and mobility: Secondary | ICD-10-CM | POA: Diagnosis not present

## 2014-01-25 DIAGNOSIS — Z96651 Presence of right artificial knee joint: Secondary | ICD-10-CM | POA: Diagnosis not present

## 2014-01-26 DIAGNOSIS — Z471 Aftercare following joint replacement surgery: Secondary | ICD-10-CM | POA: Diagnosis not present

## 2014-01-26 DIAGNOSIS — R269 Unspecified abnormalities of gait and mobility: Secondary | ICD-10-CM | POA: Diagnosis not present

## 2014-01-26 DIAGNOSIS — Z96651 Presence of right artificial knee joint: Secondary | ICD-10-CM | POA: Diagnosis not present

## 2014-02-23 DIAGNOSIS — Z471 Aftercare following joint replacement surgery: Secondary | ICD-10-CM | POA: Diagnosis not present

## 2014-02-23 DIAGNOSIS — M7541 Impingement syndrome of right shoulder: Secondary | ICD-10-CM | POA: Diagnosis not present

## 2014-02-23 DIAGNOSIS — M4727 Other spondylosis with radiculopathy, lumbosacral region: Secondary | ICD-10-CM | POA: Diagnosis not present

## 2014-02-23 DIAGNOSIS — Z96651 Presence of right artificial knee joint: Secondary | ICD-10-CM | POA: Diagnosis not present

## 2014-04-10 DIAGNOSIS — G47419 Narcolepsy without cataplexy: Secondary | ICD-10-CM | POA: Diagnosis not present

## 2014-04-10 DIAGNOSIS — G4733 Obstructive sleep apnea (adult) (pediatric): Secondary | ICD-10-CM | POA: Diagnosis not present

## 2014-05-01 DIAGNOSIS — M4727 Other spondylosis with radiculopathy, lumbosacral region: Secondary | ICD-10-CM | POA: Diagnosis not present

## 2014-06-07 DIAGNOSIS — M545 Low back pain: Secondary | ICD-10-CM | POA: Diagnosis not present

## 2014-06-09 DIAGNOSIS — Z471 Aftercare following joint replacement surgery: Secondary | ICD-10-CM | POA: Diagnosis not present

## 2014-06-09 DIAGNOSIS — Z96651 Presence of right artificial knee joint: Secondary | ICD-10-CM | POA: Diagnosis not present

## 2014-07-03 DIAGNOSIS — Z1231 Encounter for screening mammogram for malignant neoplasm of breast: Secondary | ICD-10-CM | POA: Diagnosis not present

## 2014-07-05 DIAGNOSIS — M4690 Unspecified inflammatory spondylopathy, site unspecified: Secondary | ICD-10-CM | POA: Diagnosis not present

## 2014-07-05 DIAGNOSIS — M4727 Other spondylosis with radiculopathy, lumbosacral region: Secondary | ICD-10-CM | POA: Diagnosis not present

## 2014-07-14 DIAGNOSIS — R928 Other abnormal and inconclusive findings on diagnostic imaging of breast: Secondary | ICD-10-CM | POA: Diagnosis not present

## 2014-07-17 ENCOUNTER — Other Ambulatory Visit: Payer: Self-pay | Admitting: Family Medicine

## 2014-07-17 DIAGNOSIS — N6489 Other specified disorders of breast: Secondary | ICD-10-CM

## 2014-07-26 DIAGNOSIS — M47817 Spondylosis without myelopathy or radiculopathy, lumbosacral region: Secondary | ICD-10-CM | POA: Diagnosis not present

## 2014-08-02 DIAGNOSIS — H93299 Other abnormal auditory perceptions, unspecified ear: Secondary | ICD-10-CM | POA: Diagnosis not present

## 2014-08-14 DIAGNOSIS — M4690 Unspecified inflammatory spondylopathy, site unspecified: Secondary | ICD-10-CM | POA: Diagnosis not present

## 2014-08-14 DIAGNOSIS — M4727 Other spondylosis with radiculopathy, lumbosacral region: Secondary | ICD-10-CM | POA: Diagnosis not present

## 2014-09-19 DIAGNOSIS — M4727 Other spondylosis with radiculopathy, lumbosacral region: Secondary | ICD-10-CM | POA: Diagnosis not present

## 2014-09-19 DIAGNOSIS — M4696 Unspecified inflammatory spondylopathy, lumbar region: Secondary | ICD-10-CM | POA: Diagnosis not present

## 2014-10-05 DIAGNOSIS — Z1211 Encounter for screening for malignant neoplasm of colon: Secondary | ICD-10-CM | POA: Diagnosis not present

## 2014-10-05 DIAGNOSIS — K5730 Diverticulosis of large intestine without perforation or abscess without bleeding: Secondary | ICD-10-CM | POA: Diagnosis not present

## 2014-10-11 DIAGNOSIS — M47817 Spondylosis without myelopathy or radiculopathy, lumbosacral region: Secondary | ICD-10-CM | POA: Diagnosis not present

## 2014-10-18 DIAGNOSIS — G4733 Obstructive sleep apnea (adult) (pediatric): Secondary | ICD-10-CM | POA: Diagnosis not present

## 2014-10-18 DIAGNOSIS — G47419 Narcolepsy without cataplexy: Secondary | ICD-10-CM | POA: Diagnosis not present

## 2014-10-26 DIAGNOSIS — M47816 Spondylosis without myelopathy or radiculopathy, lumbar region: Secondary | ICD-10-CM | POA: Diagnosis not present

## 2014-10-26 DIAGNOSIS — M5431 Sciatica, right side: Secondary | ICD-10-CM | POA: Diagnosis not present

## 2014-10-26 DIAGNOSIS — M5136 Other intervertebral disc degeneration, lumbar region: Secondary | ICD-10-CM | POA: Diagnosis not present

## 2014-11-07 DIAGNOSIS — M5431 Sciatica, right side: Secondary | ICD-10-CM | POA: Diagnosis not present

## 2014-11-07 DIAGNOSIS — M47816 Spondylosis without myelopathy or radiculopathy, lumbar region: Secondary | ICD-10-CM | POA: Diagnosis not present

## 2014-11-07 DIAGNOSIS — M5136 Other intervertebral disc degeneration, lumbar region: Secondary | ICD-10-CM | POA: Diagnosis not present

## 2014-11-21 DIAGNOSIS — K219 Gastro-esophageal reflux disease without esophagitis: Secondary | ICD-10-CM | POA: Diagnosis not present

## 2014-11-21 DIAGNOSIS — F3340 Major depressive disorder, recurrent, in remission, unspecified: Secondary | ICD-10-CM | POA: Diagnosis not present

## 2014-12-06 DIAGNOSIS — M5136 Other intervertebral disc degeneration, lumbar region: Secondary | ICD-10-CM | POA: Diagnosis not present

## 2014-12-07 DIAGNOSIS — M47816 Spondylosis without myelopathy or radiculopathy, lumbar region: Secondary | ICD-10-CM | POA: Diagnosis not present

## 2014-12-08 DIAGNOSIS — Z0000 Encounter for general adult medical examination without abnormal findings: Secondary | ICD-10-CM | POA: Diagnosis not present

## 2014-12-08 DIAGNOSIS — E049 Nontoxic goiter, unspecified: Secondary | ICD-10-CM | POA: Diagnosis not present

## 2014-12-08 DIAGNOSIS — R739 Hyperglycemia, unspecified: Secondary | ICD-10-CM | POA: Diagnosis not present

## 2014-12-08 DIAGNOSIS — D692 Other nonthrombocytopenic purpura: Secondary | ICD-10-CM | POA: Diagnosis not present

## 2014-12-08 DIAGNOSIS — E559 Vitamin D deficiency, unspecified: Secondary | ICD-10-CM | POA: Diagnosis not present

## 2014-12-08 DIAGNOSIS — E785 Hyperlipidemia, unspecified: Secondary | ICD-10-CM | POA: Diagnosis not present

## 2014-12-08 DIAGNOSIS — M069 Rheumatoid arthritis, unspecified: Secondary | ICD-10-CM | POA: Diagnosis not present

## 2014-12-08 DIAGNOSIS — Z79899 Other long term (current) drug therapy: Secondary | ICD-10-CM | POA: Diagnosis not present

## 2014-12-08 DIAGNOSIS — G47419 Narcolepsy without cataplexy: Secondary | ICD-10-CM | POA: Diagnosis not present

## 2014-12-18 DIAGNOSIS — M5136 Other intervertebral disc degeneration, lumbar region: Secondary | ICD-10-CM | POA: Diagnosis not present

## 2014-12-18 DIAGNOSIS — M4727 Other spondylosis with radiculopathy, lumbosacral region: Secondary | ICD-10-CM | POA: Diagnosis not present

## 2014-12-21 DIAGNOSIS — M4727 Other spondylosis with radiculopathy, lumbosacral region: Secondary | ICD-10-CM | POA: Diagnosis not present

## 2014-12-21 DIAGNOSIS — M47816 Spondylosis without myelopathy or radiculopathy, lumbar region: Secondary | ICD-10-CM | POA: Diagnosis not present

## 2014-12-21 DIAGNOSIS — M5136 Other intervertebral disc degeneration, lumbar region: Secondary | ICD-10-CM | POA: Diagnosis not present

## 2014-12-21 DIAGNOSIS — M5431 Sciatica, right side: Secondary | ICD-10-CM | POA: Diagnosis not present

## 2015-02-02 DIAGNOSIS — M4696 Unspecified inflammatory spondylopathy, lumbar region: Secondary | ICD-10-CM | POA: Diagnosis not present

## 2015-02-02 DIAGNOSIS — M545 Low back pain: Secondary | ICD-10-CM | POA: Diagnosis not present

## 2015-03-02 DIAGNOSIS — M47816 Spondylosis without myelopathy or radiculopathy, lumbar region: Secondary | ICD-10-CM | POA: Diagnosis not present

## 2015-03-22 DIAGNOSIS — M47816 Spondylosis without myelopathy or radiculopathy, lumbar region: Secondary | ICD-10-CM | POA: Diagnosis not present

## 2015-04-18 DIAGNOSIS — M47816 Spondylosis without myelopathy or radiculopathy, lumbar region: Secondary | ICD-10-CM | POA: Diagnosis not present

## 2015-05-02 IMAGING — CR DG CHEST 2V
2 series · 2 of 2 positions shown · non-contrast
Comparison: 07/24/2011

CLINICAL DATA: Right knee osteoarthritis. Preop respiratory exam
for right knee arthroplasty.

EXAM:
CHEST  2 VIEW

[w chest pa]
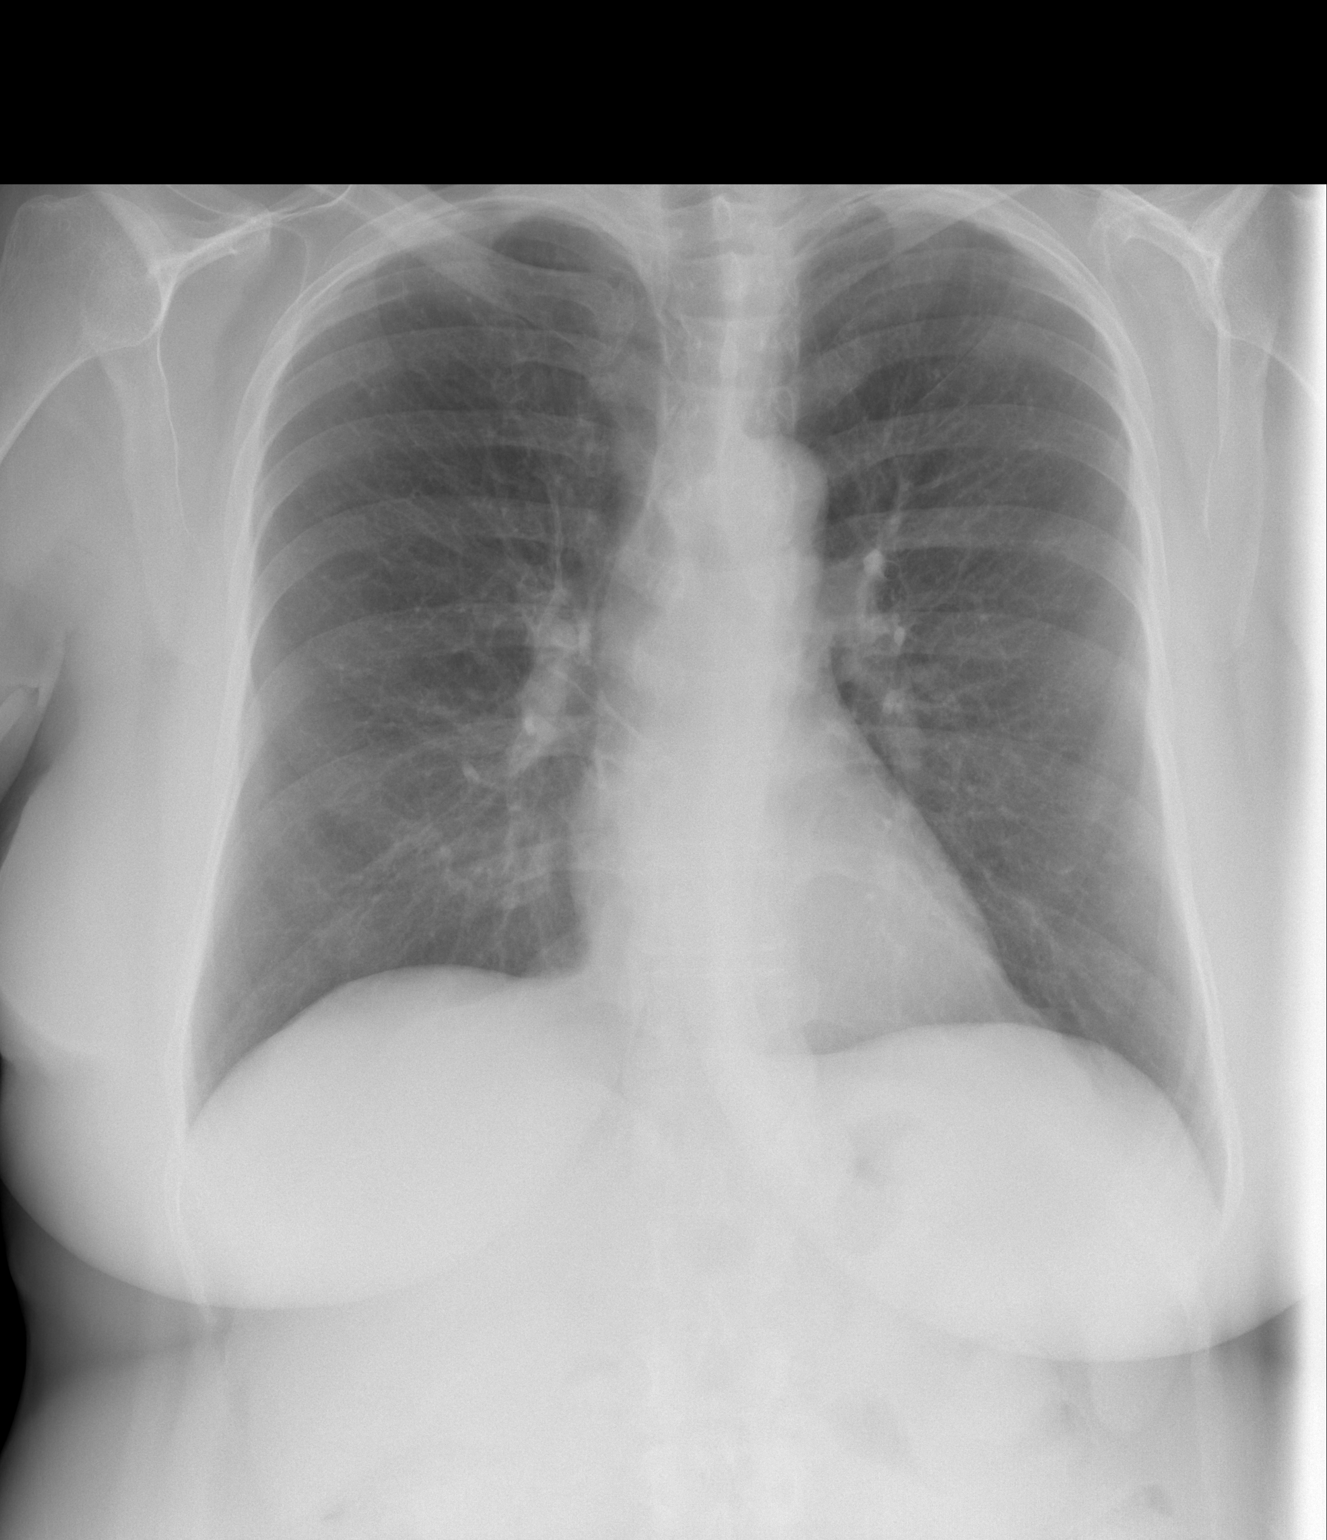

[w chest lat]
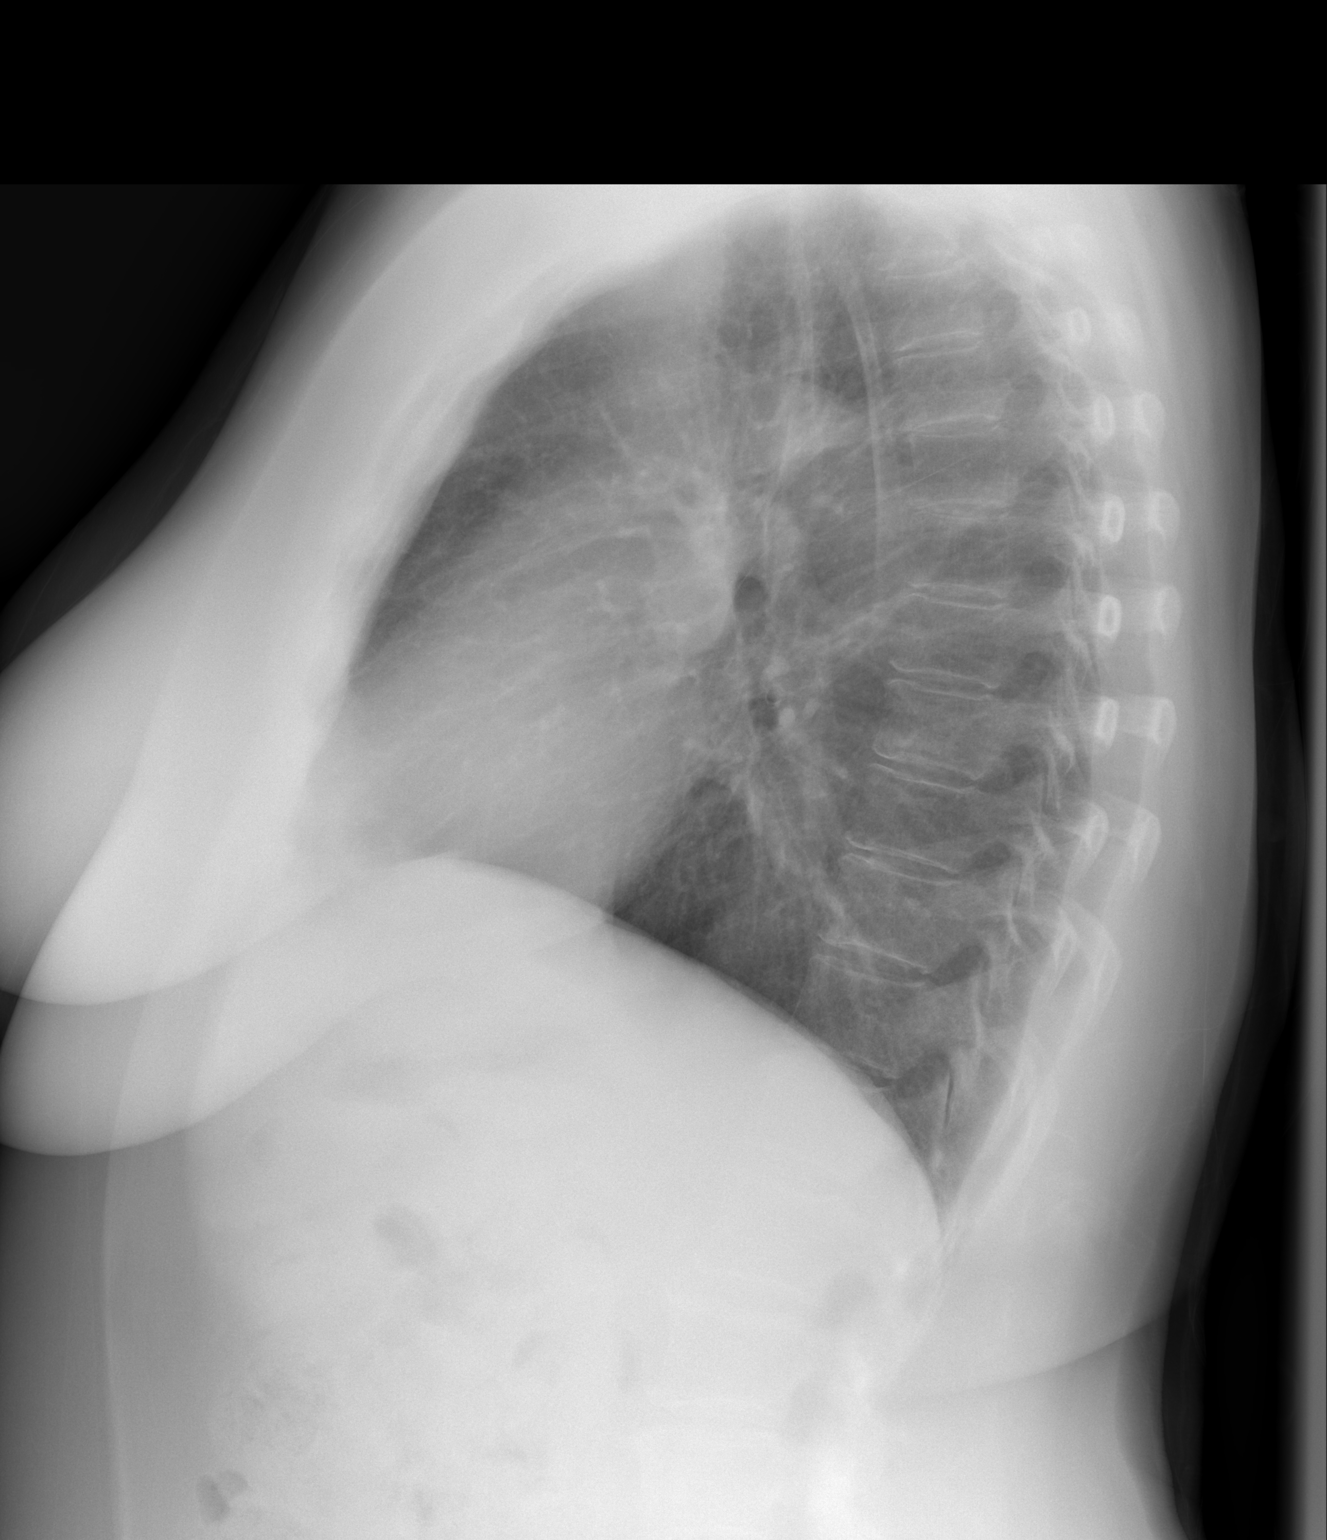

[2 of 2 positions shown; findings below may reference images not displayed]

FINDINGS: The heart size and mediastinal contours are within normal limits.
Both lungs are clear. The visualized skeletal structures are
unremarkable.
IMPRESSION: No active cardiopulmonary disease.

## 2015-05-11 DIAGNOSIS — T8484XA Pain due to internal orthopedic prosthetic devices, implants and grafts, initial encounter: Secondary | ICD-10-CM | POA: Diagnosis not present

## 2015-05-11 DIAGNOSIS — M2042 Other hammer toe(s) (acquired), left foot: Secondary | ICD-10-CM | POA: Diagnosis not present

## 2015-05-11 DIAGNOSIS — M7741 Metatarsalgia, right foot: Secondary | ICD-10-CM | POA: Diagnosis not present

## 2015-05-11 DIAGNOSIS — M2041 Other hammer toe(s) (acquired), right foot: Secondary | ICD-10-CM | POA: Diagnosis not present

## 2015-05-22 DIAGNOSIS — G47419 Narcolepsy without cataplexy: Secondary | ICD-10-CM | POA: Diagnosis not present

## 2015-06-05 ENCOUNTER — Other Ambulatory Visit: Payer: Self-pay | Admitting: Orthopedic Surgery

## 2015-06-05 DIAGNOSIS — B070 Plantar wart: Secondary | ICD-10-CM | POA: Diagnosis not present

## 2015-06-05 DIAGNOSIS — M2042 Other hammer toe(s) (acquired), left foot: Secondary | ICD-10-CM | POA: Diagnosis not present

## 2015-06-05 DIAGNOSIS — M7742 Metatarsalgia, left foot: Secondary | ICD-10-CM | POA: Diagnosis not present

## 2015-06-05 DIAGNOSIS — B078 Other viral warts: Secondary | ICD-10-CM | POA: Diagnosis not present

## 2015-06-05 DIAGNOSIS — M2041 Other hammer toe(s) (acquired), right foot: Secondary | ICD-10-CM | POA: Diagnosis not present

## 2015-06-05 DIAGNOSIS — T8484XA Pain due to internal orthopedic prosthetic devices, implants and grafts, initial encounter: Secondary | ICD-10-CM | POA: Diagnosis not present

## 2015-06-05 DIAGNOSIS — M7741 Metatarsalgia, right foot: Secondary | ICD-10-CM | POA: Diagnosis not present

## 2015-06-20 DIAGNOSIS — Z4789 Encounter for other orthopedic aftercare: Secondary | ICD-10-CM | POA: Diagnosis not present

## 2015-07-25 DIAGNOSIS — Z4789 Encounter for other orthopedic aftercare: Secondary | ICD-10-CM | POA: Diagnosis not present

## 2015-08-07 DIAGNOSIS — Z1231 Encounter for screening mammogram for malignant neoplasm of breast: Secondary | ICD-10-CM | POA: Diagnosis not present

## 2015-08-24 DIAGNOSIS — L821 Other seborrheic keratosis: Secondary | ICD-10-CM | POA: Diagnosis not present

## 2015-08-24 DIAGNOSIS — L219 Seborrheic dermatitis, unspecified: Secondary | ICD-10-CM | POA: Diagnosis not present

## 2015-08-24 DIAGNOSIS — L723 Sebaceous cyst: Secondary | ICD-10-CM | POA: Diagnosis not present

## 2015-08-24 DIAGNOSIS — D0462 Carcinoma in situ of skin of left upper limb, including shoulder: Secondary | ICD-10-CM | POA: Diagnosis not present

## 2015-08-24 DIAGNOSIS — Z4789 Encounter for other orthopedic aftercare: Secondary | ICD-10-CM | POA: Diagnosis not present

## 2015-08-24 DIAGNOSIS — M7741 Metatarsalgia, right foot: Secondary | ICD-10-CM | POA: Diagnosis not present

## 2015-08-24 DIAGNOSIS — D0361 Melanoma in situ of right upper limb, including shoulder: Secondary | ICD-10-CM | POA: Diagnosis not present

## 2015-08-24 DIAGNOSIS — L430 Hypertrophic lichen planus: Secondary | ICD-10-CM | POA: Diagnosis not present

## 2015-08-24 DIAGNOSIS — L570 Actinic keratosis: Secondary | ICD-10-CM | POA: Diagnosis not present

## 2015-08-24 DIAGNOSIS — D485 Neoplasm of uncertain behavior of skin: Secondary | ICD-10-CM | POA: Diagnosis not present

## 2015-09-18 DIAGNOSIS — M7741 Metatarsalgia, right foot: Secondary | ICD-10-CM | POA: Diagnosis not present

## 2015-09-19 DIAGNOSIS — L988 Other specified disorders of the skin and subcutaneous tissue: Secondary | ICD-10-CM | POA: Diagnosis not present

## 2015-09-19 DIAGNOSIS — D0462 Carcinoma in situ of skin of left upper limb, including shoulder: Secondary | ICD-10-CM | POA: Diagnosis not present

## 2015-09-19 DIAGNOSIS — D0361 Melanoma in situ of right upper limb, including shoulder: Secondary | ICD-10-CM | POA: Diagnosis not present

## 2015-09-25 DIAGNOSIS — M7742 Metatarsalgia, left foot: Secondary | ICD-10-CM | POA: Diagnosis not present

## 2015-09-25 DIAGNOSIS — M7741 Metatarsalgia, right foot: Secondary | ICD-10-CM | POA: Diagnosis not present

## 2015-09-27 DIAGNOSIS — M7741 Metatarsalgia, right foot: Secondary | ICD-10-CM | POA: Diagnosis not present

## 2015-10-16 DIAGNOSIS — M7741 Metatarsalgia, right foot: Secondary | ICD-10-CM | POA: Diagnosis not present

## 2015-10-23 DIAGNOSIS — M7741 Metatarsalgia, right foot: Secondary | ICD-10-CM | POA: Diagnosis not present

## 2015-10-26 DIAGNOSIS — M7741 Metatarsalgia, right foot: Secondary | ICD-10-CM | POA: Diagnosis not present

## 2015-10-30 DIAGNOSIS — M7741 Metatarsalgia, right foot: Secondary | ICD-10-CM | POA: Diagnosis not present

## 2015-11-01 DIAGNOSIS — M7741 Metatarsalgia, right foot: Secondary | ICD-10-CM | POA: Diagnosis not present

## 2015-11-02 DIAGNOSIS — M7741 Metatarsalgia, right foot: Secondary | ICD-10-CM | POA: Diagnosis not present

## 2015-11-02 DIAGNOSIS — Z969 Presence of functional implant, unspecified: Secondary | ICD-10-CM | POA: Diagnosis not present

## 2015-11-02 DIAGNOSIS — Z4789 Encounter for other orthopedic aftercare: Secondary | ICD-10-CM | POA: Diagnosis not present

## 2015-11-21 DIAGNOSIS — Z961 Presence of intraocular lens: Secondary | ICD-10-CM | POA: Diagnosis not present

## 2015-11-21 DIAGNOSIS — H04123 Dry eye syndrome of bilateral lacrimal glands: Secondary | ICD-10-CM | POA: Diagnosis not present

## 2015-11-21 DIAGNOSIS — H524 Presbyopia: Secondary | ICD-10-CM | POA: Diagnosis not present

## 2015-11-27 DIAGNOSIS — G47419 Narcolepsy without cataplexy: Secondary | ICD-10-CM | POA: Diagnosis not present

## 2015-12-04 DIAGNOSIS — Z967 Presence of other bone and tendon implants: Secondary | ICD-10-CM | POA: Diagnosis not present

## 2015-12-04 DIAGNOSIS — T8484XA Pain due to internal orthopedic prosthetic devices, implants and grafts, initial encounter: Secondary | ICD-10-CM | POA: Diagnosis not present

## 2015-12-04 DIAGNOSIS — M2042 Other hammer toe(s) (acquired), left foot: Secondary | ICD-10-CM | POA: Diagnosis not present

## 2015-12-04 DIAGNOSIS — G8918 Other acute postprocedural pain: Secondary | ICD-10-CM | POA: Diagnosis not present

## 2015-12-19 DIAGNOSIS — Z4789 Encounter for other orthopedic aftercare: Secondary | ICD-10-CM | POA: Diagnosis not present

## 2016-01-04 DIAGNOSIS — Z6831 Body mass index (BMI) 31.0-31.9, adult: Secondary | ICD-10-CM | POA: Diagnosis not present

## 2016-01-04 DIAGNOSIS — M069 Rheumatoid arthritis, unspecified: Secondary | ICD-10-CM | POA: Diagnosis not present

## 2016-01-04 DIAGNOSIS — E785 Hyperlipidemia, unspecified: Secondary | ICD-10-CM | POA: Diagnosis not present

## 2016-01-04 DIAGNOSIS — R739 Hyperglycemia, unspecified: Secondary | ICD-10-CM | POA: Diagnosis not present

## 2016-01-04 DIAGNOSIS — E049 Nontoxic goiter, unspecified: Secondary | ICD-10-CM | POA: Diagnosis not present

## 2016-01-04 DIAGNOSIS — D692 Other nonthrombocytopenic purpura: Secondary | ICD-10-CM | POA: Diagnosis not present

## 2016-01-04 DIAGNOSIS — F3340 Major depressive disorder, recurrent, in remission, unspecified: Secondary | ICD-10-CM | POA: Diagnosis not present

## 2016-01-04 DIAGNOSIS — I10 Essential (primary) hypertension: Secondary | ICD-10-CM | POA: Diagnosis not present

## 2016-01-04 DIAGNOSIS — Z79899 Other long term (current) drug therapy: Secondary | ICD-10-CM | POA: Diagnosis not present

## 2016-01-04 DIAGNOSIS — E559 Vitamin D deficiency, unspecified: Secondary | ICD-10-CM | POA: Diagnosis not present

## 2016-01-04 DIAGNOSIS — Z0000 Encounter for general adult medical examination without abnormal findings: Secondary | ICD-10-CM | POA: Diagnosis not present

## 2016-01-04 DIAGNOSIS — G47419 Narcolepsy without cataplexy: Secondary | ICD-10-CM | POA: Diagnosis not present

## 2016-01-11 DIAGNOSIS — M47816 Spondylosis without myelopathy or radiculopathy, lumbar region: Secondary | ICD-10-CM | POA: Diagnosis not present

## 2016-01-17 DIAGNOSIS — M47816 Spondylosis without myelopathy or radiculopathy, lumbar region: Secondary | ICD-10-CM | POA: Diagnosis not present

## 2016-01-17 DIAGNOSIS — M545 Low back pain: Secondary | ICD-10-CM | POA: Diagnosis not present

## 2016-01-25 ENCOUNTER — Other Ambulatory Visit: Payer: Self-pay | Admitting: Family Medicine

## 2016-01-25 DIAGNOSIS — E049 Nontoxic goiter, unspecified: Secondary | ICD-10-CM

## 2016-01-28 DIAGNOSIS — T8484XA Pain due to internal orthopedic prosthetic devices, implants and grafts, initial encounter: Secondary | ICD-10-CM | POA: Diagnosis not present

## 2016-01-28 DIAGNOSIS — M2042 Other hammer toe(s) (acquired), left foot: Secondary | ICD-10-CM | POA: Diagnosis not present

## 2016-01-28 DIAGNOSIS — Z4789 Encounter for other orthopedic aftercare: Secondary | ICD-10-CM | POA: Diagnosis not present

## 2016-01-29 ENCOUNTER — Ambulatory Visit
Admission: RE | Admit: 2016-01-29 | Discharge: 2016-01-29 | Disposition: A | Discharge status: Home / Self Care | Payer: Medicare Other | Source: Ambulatory Visit | Attending: Family Medicine | Admitting: Family Medicine

## 2016-01-29 DIAGNOSIS — E042 Nontoxic multinodular goiter: Secondary | ICD-10-CM | POA: Diagnosis not present

## 2016-01-29 DIAGNOSIS — E049 Nontoxic goiter, unspecified: Secondary | ICD-10-CM

## 2016-02-01 DIAGNOSIS — M47816 Spondylosis without myelopathy or radiculopathy, lumbar region: Secondary | ICD-10-CM | POA: Diagnosis not present

## 2016-02-27 DIAGNOSIS — M2042 Other hammer toe(s) (acquired), left foot: Secondary | ICD-10-CM | POA: Diagnosis not present

## 2016-02-27 DIAGNOSIS — Z4789 Encounter for other orthopedic aftercare: Secondary | ICD-10-CM | POA: Diagnosis not present

## 2016-03-05 ENCOUNTER — Ambulatory Visit: Payer: Self-pay | Admitting: Surgery

## 2016-03-05 DIAGNOSIS — E042 Nontoxic multinodular goiter: Secondary | ICD-10-CM | POA: Diagnosis not present

## 2016-03-21 ENCOUNTER — Encounter (HOSPITAL_COMMUNITY): Payer: Self-pay

## 2016-03-21 NOTE — Pre-Procedure Instructions (Signed)
Carla [name]  03/21/2016      [id number] [place], [place] - [address] [place] [address] Phone: [phone] Fax: [phone]  [phone] [place], [place] [address] Phone: [phone] Fax: [phone]    Your procedure is scheduled on March 23  Report to [facility] at 915 A.M.  Call this number if you have problems the morning of surgery:  [phone]   Remember:  Do not eat food or drink liquids after midnight.  Take these medicines the morning of surgery with A SIP OF WATER Tylenol if needed, adderall, clear eyes if needed, Robaxin, Pantoprazole (Protonix)  Stop taking aspirin, BC's, Goody's, herbal medications, Fish Oil, Vitamins, Aleve, Ibuprofen, Advil, Motrin   Do not wear jewelry, make-up or nail polish.  Do not wear lotions, powders, or perfumes, or deoderant.  Do not shave 48 hours prior to surgery.  Men may shave face and neck.  Do not bring valuables to the hospital.  [facility] is not responsible for any belongings or valuables.  Contacts, dentures or bridgework may not be worn into surgery.  Leave your suitcase in the car.  After surgery it may be brought to your room.  For patients admitted to the hospital, discharge time will be determined by your treatment team.  Patients discharged the day of surgery will not be allowed to drive home.   Special instructions:  [facility] - Preparing for Surgery  Before surgery, you can play an important role.  Because skin is not sterile, your skin needs to be as free of germs as possible.  You can reduce the number of germs on you skin by washing with CHG (chlorahexidine gluconate) soap before surgery.  CHG is an antiseptic cleaner which kills germs and bonds with the skin to continue killing germs even after washing.  Please DO NOT use if you have an allergy to CHG or antibacterial soaps.   If your skin becomes reddened/irritated stop using the CHG and inform your nurse when you arrive at Short Stay.  Do not shave (including legs and underarms) for at least 48 hours prior to the first CHG shower.  You may shave your face.  Please follow these instructions carefully:   1.  Shower with CHG Soap the night before surgery and the   morning of Surgery.  2.  If you choose to wash your hair, wash your hair first as usual with your  normal shampoo.  3.  After you shampoo, rinse your hair and body thoroughly to remove the Shampoo.  4.  Use CHG as you would any other liquid soap.  You can apply chg directly to the skin and wash gently with scrungie or a clean washcloth.  5.  Apply the CHG Soap to your body ONLY FROM THE NECK DOWN.   Do not use on open wounds or open sores.  Avoid contact with your eyes,  ears, mouth and genitals (private parts).  Wash genitals (private parts)  with your normal soap.  6.  Wash thoroughly, paying special attention to the area where your surgery   will be performed.  7.  Thoroughly rinse your body with warm water from the neck down.  8.  DO NOT shower/wash with your normal soap after using and rinsing off the CHG Soap.  9.  Pat yourself dry with a clean towel.  10.  Wear clean pajamas.            11.  Place clean sheets on your bed the night of your first shower and do not sleep with pets.  Day of Surgery  Do not apply any lotions/deoderants the morning of surgery.  Please wear clean clothes to the hospital/surgery center.     Please read over the following fact sheets that you were given. Pain Booklet, Coughing and Deep Breathing and Surgical Site Infection Prevention

## 2016-03-24 ENCOUNTER — Encounter (HOSPITAL_COMMUNITY)
Admission: RE | Admit: 2016-03-24 | Discharge: 2016-03-24 | Disposition: A | Discharge status: Home / Self Care | Payer: Medicare Other | Source: Ambulatory Visit | Attending: Surgery | Admitting: Surgery

## 2016-03-24 ENCOUNTER — Encounter (HOSPITAL_COMMUNITY): Payer: Self-pay | Admitting: Surgery

## 2016-03-24 ENCOUNTER — Encounter (HOSPITAL_COMMUNITY): Payer: Self-pay

## 2016-03-24 ENCOUNTER — Ambulatory Visit (HOSPITAL_COMMUNITY)
Admission: RE | Admit: 2016-03-24 | Discharge: 2016-03-24 | Disposition: A | Discharge status: Home / Self Care | Payer: Medicare Other | Source: Ambulatory Visit | Attending: Anesthesiology | Admitting: Anesthesiology

## 2016-03-24 DIAGNOSIS — I700 Atherosclerosis of aorta: Secondary | ICD-10-CM | POA: Diagnosis not present

## 2016-03-24 DIAGNOSIS — Z01818 Encounter for other preprocedural examination: Secondary | ICD-10-CM | POA: Diagnosis not present

## 2016-03-24 DIAGNOSIS — Z01812 Encounter for preprocedural laboratory examination: Secondary | ICD-10-CM | POA: Insufficient documentation

## 2016-03-24 DIAGNOSIS — E042 Nontoxic multinodular goiter: Secondary | ICD-10-CM | POA: Diagnosis present

## 2016-03-24 HISTORY — DX: Other specified postprocedural states: Z98.890

## 2016-03-24 HISTORY — DX: Nausea with vomiting, unspecified: R11.2

## 2016-03-24 HISTORY — DX: Unspecified asthma, uncomplicated: J45.909

## 2016-03-24 LAB — BASIC METABOLIC PANEL
Anion gap: 8 (ref 5–15)
BUN: 13 mg/dL (ref 6–20)
CALCIUM: 9.2 mg/dL (ref 8.9–10.3)
CO2: 25 mmol/L (ref 22–32)
Chloride: 108 mmol/L (ref 101–111)
Creatinine, Ser: 0.74 mg/dL (ref 0.44–1.00)
GFR calc Af Amer: >60 mL/min (ref >60)
Glucose, Bld: 95 mg/dL (ref 65–99)
Potassium: 3.6 mmol/L (ref 3.5–5.1)
Sodium: 141 mmol/L (ref 135–145)

## 2016-03-24 LAB — CBC
HCT: 41.3 % (ref 36.0–46.0)
Hemoglobin: 13.4 g/dL (ref 12.0–15.0)
MCH: 29.1 pg (ref 26.0–34.0)
MCHC: 32.4 g/dL (ref 30.0–36.0)
MCV: 89.8 fL (ref 78.0–100.0)
Platelets: 313 10*3/uL (ref 150–400)
RBC: 4.6 MIL/uL (ref 3.87–5.11)
RDW: 13.8 % (ref 11.5–15.5)
WBC: 5.9 10*3/uL (ref 4.0–10.5)

## 2016-03-24 NOTE — Progress Notes (Signed)
PCP is Dr. Jonathon Jordan   Sees Dr Kelli Hope for Narcolepsy. Denies ever seeing a cardiologist. Denies ever having a card cath. States she had a  Stress test and echo about 7 years ago.  Denies any chest pain, cough or fever. States she had a sleep study done years ago, and has mild sleep apnea, but doesn't wear a CPAP.

## 2016-03-24 NOTE — H&P (Signed)
General Surgery [facility]  Carla [name] 03/05/2016 9:08 AM Location: [facility] Patient #: [id number] DOB: 12/20/1950 Divorced / Language: Carla [name] / Race: White Female   History of Present Illness Carla Regal MD; 03/05/2016 9:34 AM) The patient is a 66 year old female who presents with a complaint of Enlarged [name].  By Dr. Jonathon [name] for evaluation of multinodular [name] goiter with compressive symptoms. Patient has a long-standing multinodular [name] goiter. She has been followed with ultrasound examination since 2013. She has undergone previous fine needle aspiration biopsies which were benign. Patient is noted recent increase in size of her [name] gland. She has developed mild to moderate compressive symptoms including globus sensation, nighttime dyspnea, and intermittent hoarseness. Patient has never been on [name] medication. She has had no prior head or neck surgery. She does have a family history of [name] goiter in both her maternal grandmother and her mother. As no family history of [name] malignancy. There is no family history of other endocrine neoplasms. Patient notes hair loss. He notes brittle nails. She has an appointment to see Dr. Dagmar [name] in consultation in May 2018. Patient denies tremors or palpitations.   Past Surgical History Carla Lorenzo, [name]; 1/63/8453 6:46 AM) Cataract Surgery  Bilateral. Foot Surgery  Bilateral. Hysterectomy (not due to cancer) - Partial  Knee Surgery  Right. Shoulder Surgery  Left. Spinal Surgery - Lower Back   Diagnostic Studies History Carla Lorenzo, [name]; 08/08/2120 4:82 AM) Colonoscopy  within last year Mammogram  within last year Pap Smear  >5 years ago  Allergies Carla Lorenzo, [name]; 5/00/3704 8:88 AM) Codeine and Related  Itching.  Medication History Carla Lorenzo, [name]; 09/21/9448 3:88 AM) Amphetamine-Dextroamphetamine (20MG  Tablet, Oral)  Active. Pantoprazole Sodium (40MG  Tablet DR, Oral) Active. Ocuvite Eye + Multi (Oral) Active. Medications Reconciled  Social History Carla Lorenzo, [name]; 09/02/32 9:17 AM) Caffeine use  Coffee, Tea. No alcohol use  No drug use   Family History Carla Lorenzo, [name]; 09/21/567 7:94 AM) Arthritis  Mother. Diabetes Mellitus  Mother. Hypertension  Mother. Kidney Disease  Mother. Prostate Cancer  Father. [name] problems  Family Members In General, Mother.  Pregnancy / Birth History Carla Lorenzo, [name]; 08/07/6551 7:48 AM) Age of menopause  63-60 Gravida  3 Maternal age  17-25 Para  2  Other Problems Carla Lorenzo, [name]; 2/70/7867 5:44 AM) Arthritis  Back Pain  Depression  Gastroesophageal Reflux Disease  Melanoma     Review of Systems Carla [name]; 09/25/1005 1:21 AM) General Present- Fatigue and Weight Gain. Not Present- Appetite Loss, Chills, Fever, Night Sweats and Weight Loss. Skin Present- Dryness. Not Present- Change in Wart/Mole, Hives, Jaundice, New Lesions, Non-Healing Wounds, Rash and Ulcer. Respiratory Not Present- Bloody sputum, Chronic Cough, Difficulty Breathing, Snoring and Wheezing. Cardiovascular Not Present- Chest Pain, Difficulty Breathing Lying Down, Leg Cramps, Palpitations, Rapid Heart Rate, Shortness of Breath and Swelling of Extremities. Gastrointestinal Present- Constipation, Difficulty Swallowing and Indigestion. Not Present- Abdominal Pain, Bloating, Bloody Stool, Change in Bowel Habits, Chronic diarrhea, Excessive gas, Gets full quickly at meals, Hemorrhoids, Nausea, Rectal Pain and Vomiting. Female Genitourinary Not Present- Frequency, Nocturia, Painful Urination, Pelvic Pain and Urgency. Musculoskeletal Present- Back Pain, Joint Pain and Joint Stiffness. Not Present- Muscle Pain, Muscle Weakness and Swelling of Extremities. Neurological Not Present- Decreased Memory, Fainting, Headaches, Numbness, Seizures, Tingling, Tremor,  Trouble walking and Weakness. Psychiatric Present- Depression. Not Present- Anxiety, Bipolar, Change in Sleep Pattern, Fearful and Frequent crying. Endocrine Present- Hair Changes, Heat Intolerance  and Hot flashes. Not Present- Cold Intolerance, Excessive Hunger and New Diabetes. Hematology Present- Easy Bruising. Not Present- Blood Thinners, Excessive bleeding, Gland problems, HIV and Persistent Infections.  Vitals Carla Billings Dockery [name]; 07/16/6267 4:85 AM) 03/05/2016 9:08 AM Weight: 186.4 lb Height: 67in Body Surface Area: 1.96 m Body Mass Index: 29.19 kg/m  Temp.: 97.59F(Oral)  Pulse: 82 (Regular)  BP: 122/82 (Sitting, Left Arm, Standard)       Physical Exam Carla Regal MD; 03/05/2016 9:35 AM) The physical exam findings are as follows: Note:General - appears comfortable, no distress; not diaphorectic  HEENT - normocephalic; sclerae clear, gaze conjugate; mucous membranes moist, dentition good; voice normal  Neck - asymmetric on extension; no palpable anterior or posterior cervical adenopathy; palpation reveals a firm multinodular [name] gland without discrete or dominant mass; left lobe is larger in size than the right but without significant tracheal shift. There is mild tenderness to palpation of the [name] gland.  Chest - clear bilaterally without rhonchi, rales, or wheeze  Cor - regular rhythm with normal rate; no significant murmur  Ext - non-tender without significant edema or lymphedema  Neuro - grossly intact; no tremor    Assessment & Plan Carla Regal MD; 03/05/2016 9:38 AM) Carla [name] (E04.2) Current Plans Pt Education - Pamphlet Given - The [name] Book: discussed with patient and provided information. Patient presents with multinodular [name] goiter with compressive symptoms. Patient is provided with written literature on [name] disease to review at home. Patient is also given a copy of her recent ultrasound report.  Patient and  I discussed her compressive symptoms and her enlarging multinodular [name] goiter. She wishes to proceed with total thyroidectomy. We discussed the procedure in detail. We discussed the surgical procedure, the hospital stay, and the postoperative recovery. We discussed the need for lifelong [name] hormone replacement. We discussed potential complications including recurrent laryngeal nerve injury and injury to parathyroid glands. Patient understands and wishes to proceed with surgery in the near future. Postoperatively we will make arrangements for her to be seen by endocrinology for management of her [name] hormone replacement.  The risks and benefits of the procedure have been discussed at length with the patient. The patient understands the proposed procedure, potential alternative treatments, and the course of recovery to be expected. All of the patient's questions have been answered at this time. The patient wishes to proceed with surgery.  Carla Regal, MD, [facility] Office: [phone]

## 2016-03-27 ENCOUNTER — Encounter (HOSPITAL_COMMUNITY): Payer: Self-pay | Admitting: Surgery

## 2016-03-28 ENCOUNTER — Encounter (HOSPITAL_COMMUNITY)
Admission: RE | Disposition: A | Discharge status: Home / Self Care | Payer: Self-pay | Source: Ambulatory Visit | Attending: Surgery

## 2016-03-28 ENCOUNTER — Observation Stay (HOSPITAL_COMMUNITY)
Admission: RE | Admit: 2016-03-28 | Discharge: 2016-03-29 | Disposition: A | Discharge status: Home / Self Care | Payer: Medicare Other | Source: Ambulatory Visit | Attending: Surgery | Admitting: Surgery

## 2016-03-28 ENCOUNTER — Encounter (HOSPITAL_COMMUNITY): Payer: Self-pay | Admitting: Surgery

## 2016-03-28 ENCOUNTER — Ambulatory Visit (HOSPITAL_COMMUNITY): Payer: Medicare Other | Admitting: Anesthesiology

## 2016-03-28 ENCOUNTER — Ambulatory Visit (HOSPITAL_COMMUNITY): Payer: Medicare Other | Admitting: Emergency Medicine

## 2016-03-28 DIAGNOSIS — F329 Major depressive disorder, single episode, unspecified: Secondary | ICD-10-CM | POA: Diagnosis not present

## 2016-03-28 DIAGNOSIS — Z8261 Family history of arthritis: Secondary | ICD-10-CM | POA: Diagnosis not present

## 2016-03-28 DIAGNOSIS — E785 Hyperlipidemia, unspecified: Secondary | ICD-10-CM | POA: Insufficient documentation

## 2016-03-28 DIAGNOSIS — E063 Autoimmune thyroiditis: Secondary | ICD-10-CM | POA: Diagnosis not present

## 2016-03-28 DIAGNOSIS — Z833 Family history of diabetes mellitus: Secondary | ICD-10-CM | POA: Diagnosis not present

## 2016-03-28 DIAGNOSIS — J45909 Unspecified asthma, uncomplicated: Secondary | ICD-10-CM | POA: Insufficient documentation

## 2016-03-28 DIAGNOSIS — Z8042 Family history of malignant neoplasm of prostate: Secondary | ICD-10-CM | POA: Insufficient documentation

## 2016-03-28 DIAGNOSIS — Z87891 Personal history of nicotine dependence: Secondary | ICD-10-CM | POA: Insufficient documentation

## 2016-03-28 DIAGNOSIS — E042 Nontoxic multinodular goiter: Secondary | ICD-10-CM | POA: Diagnosis not present

## 2016-03-28 DIAGNOSIS — Z79899 Other long term (current) drug therapy: Secondary | ICD-10-CM | POA: Diagnosis not present

## 2016-03-28 DIAGNOSIS — K219 Gastro-esophageal reflux disease without esophagitis: Secondary | ICD-10-CM | POA: Diagnosis not present

## 2016-03-28 DIAGNOSIS — M1990 Unspecified osteoarthritis, unspecified site: Secondary | ICD-10-CM | POA: Insufficient documentation

## 2016-03-28 DIAGNOSIS — Z8249 Family history of ischemic heart disease and other diseases of the circulatory system: Secondary | ICD-10-CM | POA: Diagnosis not present

## 2016-03-28 DIAGNOSIS — G47419 Narcolepsy without cataplexy: Secondary | ICD-10-CM | POA: Insufficient documentation

## 2016-03-28 HISTORY — PX: THYROIDECTOMY: SHX17

## 2016-03-28 SURGERY — THYROIDECTOMY
Anesthesia: General | Site: Neck

## 2016-03-28 MED ORDER — HEMOSTATIC AGENTS (NO CHARGE) OPTIME
TOPICAL | Status: DC | PRN
  Administered 2016-03-28: 1 via TOPICAL

## 2016-03-28 MED ORDER — 0.9 % SODIUM CHLORIDE (POUR BTL) OPTIME
TOPICAL | Status: DC | PRN
  Administered 2016-03-28: 1000 mL

## 2016-03-28 MED ORDER — HYDROMORPHONE HCL 1 MG/ML IJ SOLN
INTRAMUSCULAR | Status: AC
  Filled 2016-03-28: qty 0.5

## 2016-03-28 MED ORDER — FENTANYL CITRATE (PF) 100 MCG/2ML IJ SOLN
INTRAMUSCULAR | Status: AC
  Filled 2016-03-28: qty 2

## 2016-03-28 MED ORDER — ONDANSETRON HCL 4 MG/2ML IJ SOLN
INTRAMUSCULAR | Status: AC
  Filled 2016-03-28: qty 2

## 2016-03-28 MED ORDER — DEXAMETHASONE SODIUM PHOSPHATE 10 MG/ML IJ SOLN
INTRAMUSCULAR | Status: DC | PRN
  Administered 2016-03-28: 10 mg via INTRAVENOUS

## 2016-03-28 MED ORDER — ONDANSETRON HCL 4 MG/2ML IJ SOLN
4.0000 mg | Freq: Four times a day (QID) | INTRAMUSCULAR | Status: DC | PRN
  Administered 2016-03-28: 4 mg via INTRAVENOUS
  Filled 2016-03-28: qty 2

## 2016-03-28 MED ORDER — MIDAZOLAM HCL 5 MG/5ML IJ SOLN
INTRAMUSCULAR | Status: DC | PRN
  Administered 2016-03-28: 2 mg via INTRAVENOUS

## 2016-03-28 MED ORDER — LABETALOL HCL 5 MG/ML IV SOLN
INTRAVENOUS | Status: AC
  Filled 2016-03-28: qty 8

## 2016-03-28 MED ORDER — MIDAZOLAM HCL 2 MG/2ML IJ SOLN
INTRAMUSCULAR | Status: AC
  Filled 2016-03-28: qty 2

## 2016-03-28 MED ORDER — PANTOPRAZOLE SODIUM 40 MG PO TBEC
40.0000 mg | DELAYED_RELEASE_TABLET | Freq: Every day | ORAL | Status: DC
  Administered 2016-03-28: 40 mg via ORAL
  Filled 2016-03-28: qty 1

## 2016-03-28 MED ORDER — SUGAMMADEX SODIUM 200 MG/2ML IV SOLN
INTRAVENOUS | Status: DC | PRN
  Administered 2016-03-28: 160 mg via INTRAVENOUS

## 2016-03-28 MED ORDER — CALCIUM CARBONATE ANTACID 500 MG PO CHEW: 2.0000 | CHEWABLE_TABLET | Freq: Three times a day (TID) | ORAL | 1 refills | Status: AC

## 2016-03-28 MED ORDER — CALCIUM CARBONATE 1250 (500 CA) MG PO TABS
2.0000 | ORAL_TABLET | Freq: Three times a day (TID) | ORAL | Status: DC
  Administered 2016-03-28 – 2016-03-29 (×2): 1000 mg via ORAL
  Filled 2016-03-28 (×2): qty 1

## 2016-03-28 MED ORDER — ONDANSETRON HCL 4 MG/2ML IJ SOLN
INTRAMUSCULAR | Status: DC | PRN
  Administered 2016-03-28: 4 mg via INTRAVENOUS

## 2016-03-28 MED ORDER — FENTANYL CITRATE (PF) 100 MCG/2ML IJ SOLN
INTRAMUSCULAR | Status: DC | PRN
  Administered 2016-03-28 (×2): 50 ug via INTRAVENOUS
  Administered 2016-03-28: 100 ug via INTRAVENOUS
  Administered 2016-03-28: 50 ug via INTRAVENOUS

## 2016-03-28 MED ORDER — ROCURONIUM BROMIDE 100 MG/10ML IV SOLN
INTRAVENOUS | Status: DC | PRN
  Administered 2016-03-28: 50 mg via INTRAVENOUS

## 2016-03-28 MED ORDER — LEVOTHYROXINE SODIUM 88 MCG PO TABS: 88.0000 ug | ORAL_TABLET | Freq: Every day | ORAL | 3 refills | Status: AC

## 2016-03-28 MED ORDER — CHLORHEXIDINE GLUCONATE CLOTH 2 % EX PADS: 6.0000 | MEDICATED_PAD | Freq: Once | CUTANEOUS | Status: DC

## 2016-03-28 MED ORDER — PROPOFOL 10 MG/ML IV BOLUS
INTRAVENOUS | Status: AC
  Filled 2016-03-28: qty 20

## 2016-03-28 MED ORDER — HYDROCODONE-ACETAMINOPHEN 5-325 MG PO TABS: 1.0000 | ORAL_TABLET | ORAL | 0 refills | Status: AC | PRN

## 2016-03-28 MED ORDER — LIDOCAINE HCL (CARDIAC) 20 MG/ML IV SOLN
INTRAVENOUS | Status: DC | PRN
  Administered 2016-03-28: 60 mg via INTRAVENOUS

## 2016-03-28 MED ORDER — HYDROCODONE-ACETAMINOPHEN 5-325 MG PO TABS
1.0000 | ORAL_TABLET | ORAL | Status: DC | PRN
  Administered 2016-03-28 – 2016-03-29 (×5): 2 via ORAL
  Filled 2016-03-28 (×5): qty 2

## 2016-03-28 MED ORDER — ACETAMINOPHEN 325 MG PO TABS: 650.0000 mg | ORAL_TABLET | Freq: Four times a day (QID) | ORAL | Status: DC | PRN

## 2016-03-28 MED ORDER — LACTATED RINGERS IV SOLN
INTRAVENOUS | Status: DC
  Administered 2016-03-28 (×2): via INTRAVENOUS

## 2016-03-28 MED ORDER — ACETAMINOPHEN 650 MG RE SUPP: 650.0000 mg | Freq: Four times a day (QID) | RECTAL | Status: DC | PRN

## 2016-03-28 MED ORDER — ONDANSETRON 4 MG PO TBDP: 4.0000 mg | ORAL_TABLET | Freq: Four times a day (QID) | ORAL | Status: DC | PRN

## 2016-03-28 MED ORDER — FENTANYL CITRATE (PF) 100 MCG/2ML IJ SOLN
INTRAMUSCULAR | Status: AC
  Filled 2016-03-28: qty 4

## 2016-03-28 MED ORDER — CEFAZOLIN SODIUM-DEXTROSE 2-4 GM/100ML-% IV SOLN
2.0000 g | INTRAVENOUS | Status: AC
  Administered 2016-03-28: 2 g via INTRAVENOUS
  Filled 2016-03-28: qty 100

## 2016-03-28 MED ORDER — KCL IN DEXTROSE-NACL 20-5-0.45 MEQ/L-%-% IV SOLN
INTRAVENOUS | Status: DC
  Administered 2016-03-28 – 2016-03-29 (×2): via INTRAVENOUS
  Filled 2016-03-28 (×2): qty 1000

## 2016-03-28 MED ORDER — HYDROMORPHONE HCL 1 MG/ML IJ SOLN: 1.0000 mg | INTRAMUSCULAR | Status: DC | PRN

## 2016-03-28 MED ORDER — HYDROMORPHONE HCL 1 MG/ML IJ SOLN
0.2500 mg | INTRAMUSCULAR | Status: DC | PRN
  Administered 2016-03-28 (×3): 0.5 mg via INTRAVENOUS

## 2016-03-28 MED ORDER — PROPOFOL 10 MG/ML IV BOLUS
INTRAVENOUS | Status: DC | PRN
  Administered 2016-03-28: 120 mg via INTRAVENOUS

## 2016-03-28 SURGICAL SUPPLY — 47 items
ATTRACTOMAT 16X20 MAGNETIC DRP (DRAPES) ×2 IMPLANT
BLADE SURG 10 STRL SS (BLADE) ×2 IMPLANT
BLADE SURG 15 STRL LF DISP TIS (BLADE) ×1 IMPLANT
BLADE SURG 15 STRL SS (BLADE) ×1
CANISTER SUCT 3000ML PPV (MISCELLANEOUS) ×2 IMPLANT
CHLORAPREP W/TINT 10.5 ML (MISCELLANEOUS) ×2 IMPLANT
CLIP TI MEDIUM 24 (CLIP) ×2 IMPLANT
CLIP TI WIDE RED SMALL 24 (CLIP) ×2 IMPLANT
COVER SURGICAL LIGHT HANDLE (MISCELLANEOUS) ×2 IMPLANT
CRADLE DONUT ADULT HEAD (MISCELLANEOUS) ×2 IMPLANT
DRAPE LAPAROTOMY 100X72 PEDS (DRAPES) ×2 IMPLANT
DRAPE UTILITY XL STRL (DRAPES) ×2 IMPLANT
ELECT CAUTERY BLADE 6.4 (BLADE) ×2 IMPLANT
ELECT REM PT RETURN 9FT ADLT (ELECTROSURGICAL) ×2
ELECTRODE REM PT RTRN 9FT ADLT (ELECTROSURGICAL) ×1 IMPLANT
GAUZE SPONGE 4X4 12PLY STRL (GAUZE/BANDAGES/DRESSINGS) ×2 IMPLANT
GAUZE SPONGE 4X4 16PLY XRAY LF (GAUZE/BANDAGES/DRESSINGS) ×2 IMPLANT
GLOVE BIOGEL PI IND STRL 6.5 (GLOVE) ×2 IMPLANT
GLOVE BIOGEL PI IND STRL 7.0 (GLOVE) ×1 IMPLANT
GLOVE BIOGEL PI INDICATOR 6.5 (GLOVE) ×2
GLOVE BIOGEL PI INDICATOR 7.0 (GLOVE) ×1
GLOVE ECLIPSE 6.0 STRL STRAW (GLOVE) ×4 IMPLANT
GLOVE SURG ORTHO 8.0 STRL STRW (GLOVE) ×2 IMPLANT
GLOVE SURG SS PI 7.0 STRL IVOR (GLOVE) ×2 IMPLANT
GOWN STRL REUS W/ TWL LRG LVL3 (GOWN DISPOSABLE) ×3 IMPLANT
GOWN STRL REUS W/ TWL XL LVL3 (GOWN DISPOSABLE) ×1 IMPLANT
GOWN STRL REUS W/TWL LRG LVL3 (GOWN DISPOSABLE) ×3
GOWN STRL REUS W/TWL XL LVL3 (GOWN DISPOSABLE) ×1
HEMOSTAT SURGICEL 2X4 FIBR (HEMOSTASIS) ×2 IMPLANT
ILLUMINATOR WAVEGUIDE N/F (MISCELLANEOUS) ×2 IMPLANT
KIT BASIN OR (CUSTOM PROCEDURE TRAY) ×2 IMPLANT
KIT ROOM TURNOVER OR (KITS) ×2 IMPLANT
NS IRRIG 1000ML POUR BTL (IV SOLUTION) ×2 IMPLANT
PACK SURGICAL SETUP 50X90 (CUSTOM PROCEDURE TRAY) ×2 IMPLANT
PAD ARMBOARD 7.5X6 YLW CONV (MISCELLANEOUS) ×2 IMPLANT
PENCIL BUTTON HOLSTER BLD 10FT (ELECTRODE) ×2 IMPLANT
SHEARS HARMONIC 9CM CVD (BLADE) ×2 IMPLANT
SPONGE INTESTINAL PEANUT (DISPOSABLE) ×2 IMPLANT
STRIP CLOSURE SKIN 1/2X4 (GAUZE/BANDAGES/DRESSINGS) ×2 IMPLANT
SUT MNCRL AB 4-0 PS2 18 (SUTURE) ×2 IMPLANT
SUT SILK 2 0 (SUTURE)
SUT SILK 2-0 18XBRD TIE 12 (SUTURE) IMPLANT
SUT VIC AB 3-0 SH 18 (SUTURE) ×4 IMPLANT
SYR BULB 3OZ (MISCELLANEOUS) ×2 IMPLANT
TAPE CLOTH SURG 4X10 WHT LF (GAUZE/BANDAGES/DRESSINGS) ×2 IMPLANT
TOWEL OR 17X26 10 PK STRL BLUE (TOWEL DISPOSABLE) ×2 IMPLANT
TUBE CONNECTING 12X1/4 (SUCTIONS) ×2 IMPLANT

## 2016-03-28 NOTE — Transfer of Care (Signed)
Immediate Anesthesia Transfer of Care Note  Patient: Carla [name]  Procedure(s) Performed: Procedure(s): TOTAL THYROIDECTOMY (N/A)  Patient Location: PACU  Anesthesia Type:General  Level of Consciousness: awake, alert  and oriented  Airway & Oxygen Therapy: Patient Spontanous Breathing and Patient connected to nasal cannula oxygen  Post-op Assessment: Report given to RN, Post -op Vital signs reviewed and stable and Patient moving all extremities X 4  Post vital signs: Reviewed and stable  Last Vitals:  Vitals:   03/28/16 0917  BP: 127/64  Pulse: 74  Resp: 18  Temp: 36.3 C    Last Pain:  Vitals:   03/28/16 0917  TempSrc: Oral         Complications: No apparent anesthesia complications

## 2016-03-28 NOTE — Anesthesia Postprocedure Evaluation (Signed)
Anesthesia Post Note  Patient: Sreshta Cressler  Procedure(s) Performed: Procedure(s) (LRB): TOTAL THYROIDECTOMY (N/A)  Patient location during evaluation: PACU Anesthesia Type: General Level of consciousness: awake and alert Pain management: pain level controlled Vital Signs Assessment: post-procedure vital signs reviewed and stable Respiratory status: spontaneous breathing, nonlabored ventilation and respiratory function stable Cardiovascular status: blood pressure returned to baseline and stable Postop Assessment: no signs of nausea or vomiting Anesthetic complications: no       Last Vitals:  Vitals:   03/28/16 1330 03/28/16 1345  BP: (!) 152/87   Pulse: 78 74  Resp: 16   Temp:  36.6 C    Last Pain:  Vitals:   03/28/16 1345  TempSrc:   PainSc: Asleep                 Maribelle Hopple,W. EDMOND

## 2016-03-28 NOTE — Anesthesia Preprocedure Evaluation (Addendum)
Anesthesia Evaluation  Patient identified by MRN, date of birth, ID band Patient awake    Reviewed: Allergy & Precautions, H&P , NPO status , Patient's Chart, lab work & pertinent test results  History of Anesthesia Complications (+) PONV and history of anesthetic complications  Airway Mallampati: II  TM Distance: >3 FB Neck ROM: Full    Dental no notable dental hx. (+) Teeth Intact, Dental Advisory Given   Pulmonary shortness of breath and with exertion, asthma , former smoker,    Pulmonary exam normal breath sounds clear to auscultation       Cardiovascular negative cardio ROS   Rhythm:Regular Rate:Normal     Neuro/Psych Depression negative neurological ROS     GI/Hepatic Neg liver ROS, GERD  Medicated and Controlled,  Endo/Other  negative endocrine ROS  Renal/GU negative Renal ROS  negative genitourinary   Musculoskeletal  (+) Arthritis , Osteoarthritis,    Abdominal   Peds  Hematology negative hematology ROS (+)   Anesthesia Other Findings   Reproductive/Obstetrics negative OB ROS                           Anesthesia Physical Anesthesia Plan  ASA: II  Anesthesia Plan: General   Post-op Pain Management:    Induction: Intravenous  Airway Management Planned: Oral ETT  Additional Equipment:   Intra-op Plan:   Post-operative Plan: Extubation in OR  Informed Consent: I have reviewed the patients History and Physical, chart, labs and discussed the procedure including the risks, benefits and alternatives for the proposed anesthesia with the patient or authorized representative who has indicated his/her understanding and acceptance.   Dental advisory given and Dental Advisory Given  Plan Discussed with: CRNA, Anesthesiologist and Surgeon  Anesthesia Plan Comments:        Anesthesia Quick Evaluation

## 2016-03-28 NOTE — Op Note (Signed)
Procedure Note  Pre-operative Diagnosis:  Multinodular thyroid goiter with compressive symptoms  Post-operative Diagnosis:  same  Surgeon:  [name], MD, FACS  Assistant:  none   Procedure:  Total thyroidectomy  Anesthesia:  General  Estimated Blood Loss:  minimal  Drains: none         Specimen: thyroid to pathology  Indications:  The patient is a 66 year old female who presents with a complaint of Enlarged thyroid. Patient is referred by Dr. Jonathon Jordan for evaluation of multinodular thyroid goiter with compressive symptoms. Patient has a long-standing multinodular thyroid goiter. She has been followed with ultrasound examination since 2013. She has undergone previous fine needle aspiration biopsies which were benign. Patient is noted recent increase in size of her thyroid gland. She has developed mild to moderate compressive symptoms including globus sensation, nighttime dyspnea, and intermittent hoarseness.   Procedure Details: Procedure was done in OR #2 at the [facility].  The patient was brought to the operating room and placed in a supine position on the operating room table.  Following administration of general anesthesia, the patient was positioned and then prepped and draped in the usual aseptic fashion.  After ascertaining that an adequate level of anesthesia had been achieved, a Kocher incision was made with #15 blade.  Dissection was carried through subcutaneous tissues and platysma. Hemostasis was achieved with the electrocautery.  Skin flaps were elevated cephalad and caudad from the thyroid notch to the sternal notch.  The Mahorner self-retaining retractor was placed for exposure.  Strap muscles were incised in the midline and dissection was begun on the left side.  Strap muscles were reflected laterally.  Left thyroid lobe was moderately enlarged and multinodular.  The left lobe was gently mobilized with blunt dissection.  Superior pole vessels were  dissected out and divided individually between small and medium Ligaclips with the Harmonic scalpel.  The thyroid lobe was rolled anteriorly.  Branches of the inferior thyroid artery were divided between small Ligaclips with the Harmonic scalpel.  Inferior venous tributaries were divided between Ligaclips.  Both the superior and inferior parathyroid glands were identified and preserved on their vascular pedicles.  The recurrent laryngeal nerve was identified and preserved along its course.  The ligament of Gwenlyn Found was released with the electrocautery and the gland was mobilized onto the anterior trachea. Isthmus was mobilized across the midline.  There was a moderate sized pyramidal lobe present which was dissected off of the thyroid cartilage and resected en bloc with the isthmus.  Dry pack was placed in the left neck.  Next, the right thyroid lobe was gently mobilized with blunt dissection.  Right thyroid lobe was moderately enlarged and multinodular.  Superior pole vessels were dissected out and divided between small and medium Ligaclips with the Harmonic scalpel.  Superior parathyroid was identified and preserved.  Inferior venous tributaries were divided between medium Ligaclips with the Harmonic scalpel.  The right thyroid lobe was rolled anteriorly and the branches of the inferior thyroid artery divided between small Ligaclips.  The right recurrent laryngeal nerve was identified and preserved along its course.  The ligament of Gwenlyn Found was released with the electrocautery.  The right thyroid lobe was mobilized onto the anterior trachea and the remainder of the thyroid was dissected off the anterior trachea and the thyroid was completely excised.  A suture was used to mark the left lobe. The entire thyroid gland was submitted to pathology for review.  The neck was irrigated with warm  saline.  Fibrillar was placed throughout the operative field.  Strap muscles were reapproximated in the midline with interrupted  3-0 Vicryl sutures.  Platysma was closed with interrupted 3-0 Vicryl sutures.  Skin was closed with a running 4-0 Monocryl subcuticular suture.  Wound was washed and dried and steri-strips were applied.  Dry gauze dressing was placed.  The patient was awakened from anesthesia and brought to the recovery room.  The patient tolerated the procedure well.   [name], MD, [facility] Office: [phone]

## 2016-03-28 NOTE — Anesthesia Procedure Notes (Signed)
Procedure Name: Intubation Date/Time: 03/28/2016 10:51 AM Performed by: Neldon Newport Pre-anesthesia Checklist: Patient identified, Emergency Drugs available, Suction available and Patient being monitored Patient Re-evaluated:Patient Re-evaluated prior to inductionOxygen Delivery Method: Circle system utilized Preoxygenation: Pre-oxygenation with 100% oxygen Intubation Type: IV induction Ventilation: Mask ventilation without difficulty Laryngoscope Size: Miller and 2 Grade View: Grade I Tube type: Oral Tube size: 7.0 mm Number of attempts: 1 Airway Equipment and Method: Stylet Placement Confirmation: ETT inserted through vocal cords under direct vision,  positive ETCO2 and breath sounds checked- equal and bilateral Secured at: 21 cm Tube secured with: Tape Dental Injury: Teeth and Oropharynx as per pre-operative assessment

## 2016-03-28 NOTE — Interval H&P Note (Signed)
History and Physical Interval Note:  03/28/2016 10:15 AM  Carla [name]  has presented today for surgery, with the diagnosis of Multilobular thyroid goiter. The various methods of treatment have been discussed with the patient and family. After consideration of risks, benefits and other options for treatment, the patient has consented to    Procedure(s): TOTAL THYROIDECTOMY (N/A) as a surgical intervention .    The patient's history has been reviewed, patient examined, no change in status, stable for surgery.  I have reviewed the patient's chart and labs.  Questions were answered to the patient's satisfaction.    [name], MD, [facility] Office: [place]

## 2016-03-28 NOTE — Discharge Instructions (Signed)
[facility] ° °THYROID & PARATHYROID SURGERY:  POST-OP INSTRUCTIONS ° °Always review your discharge instruction sheet from the facility where your surgery was performed. ° °A prescription for pain medication may be given to you upon discharge.  Take your pain medication as prescribed.  If narcotic pain medicine is not needed, then you may take acetaminophen (Tylenol) or ibuprofen (Advil) as needed. ° °Take your usually prescribed medications unless otherwise directed. ° °If you need a refill on your pain medication, please contact our office during regular business hours.  Prescriptions will not be processed by our office after 5 pm or on weekends. ° °Start with a light diet upon arrival home, such as soup and crackers or toast.  Be sure to drink plenty of fluids daily.  Resume your normal diet the day after surgery. ° °Most patients will experience some swelling and bruising on the chest and neck area.  Ice packs will help.  Swelling and bruising can take several days to resolve.  ° °It is common to experience some constipation after surgery.  Increasing fluid intake and taking a stool softener (Colace) will usually help or prevent this problem.  A mild laxative (Milk of Magnesia or Miralax) should be taken according to package directions if there has been no bowel movement after 48 hours. ° °You have steri-strips and a gauze dressing over your incision.  You may remove the gauze bandage on the second day after surgery, and you may shower at that time.  Leave your steri-strips (small skin tapes) in place directly over the incision.  These strips should remain on the skin for 5-7 days and then be removed.  You may get them wet in the shower and pat them dry. ° °You may resume regular (light) daily activities beginning the next day - such as daily self-care, walking, climbing stairs - gradually increasing activities as tolerated.  You may have sexual intercourse when it is comfortable.  Refrain  from any heavy lifting or straining until approved by your doctor.  You may drive when you no longer are taking prescription pain medication, you can comfortably wear a seatbelt, and you can safely maneuver your car and apply brakes. ° °You should see your doctor in the office for a follow-up appointment approximately three weeks after your surgery.  Make sure that you call for this appointment within a day or two after you arrive home to insure a convenient appointment time. ° °WHEN TO CALL YOUR DOCTOR: °-- Fever greater than 101.5 °-- Inability to urinate °-- Nausea and/or vomiting - persistent °-- Extreme swelling or bruising °-- Continued bleeding from incision °-- Increased pain, redness, or drainage from the incision °-- Difficulty swallowing or breathing °-- Muscle cramping or spasms °-- Numbness or tingling in hands or around lips ° °The clinic staff is available to answer your questions during regular business hours.  Please don’t hesitate to call and ask to speak to one of the nurses if you have concerns. ° °Carla [name] M. Carla Kobel, MD, FACS °General & Endocrine Surgery °[facility] °Office: [phone] ° °Website: [email or web address] ° ° °

## 2016-03-29 ENCOUNTER — Encounter (HOSPITAL_COMMUNITY): Payer: Self-pay | Admitting: Surgery

## 2016-03-29 DIAGNOSIS — E063 Autoimmune thyroiditis: Secondary | ICD-10-CM | POA: Diagnosis not present

## 2016-03-29 DIAGNOSIS — E042 Nontoxic multinodular goiter: Secondary | ICD-10-CM | POA: Diagnosis not present

## 2016-03-29 DIAGNOSIS — J45909 Unspecified asthma, uncomplicated: Secondary | ICD-10-CM | POA: Diagnosis not present

## 2016-03-29 DIAGNOSIS — K219 Gastro-esophageal reflux disease without esophagitis: Secondary | ICD-10-CM | POA: Diagnosis not present

## 2016-03-29 DIAGNOSIS — M1990 Unspecified osteoarthritis, unspecified site: Secondary | ICD-10-CM | POA: Diagnosis not present

## 2016-03-29 DIAGNOSIS — F329 Major depressive disorder, single episode, unspecified: Secondary | ICD-10-CM | POA: Diagnosis not present

## 2016-03-29 LAB — BASIC METABOLIC PANEL
Anion gap: 8 (ref 5–15)
BUN: 10 mg/dL (ref 6–20)
CO2: 29 mmol/L (ref 22–32)
Calcium: 9.7 mg/dL (ref 8.9–10.3)
Chloride: 104 mmol/L (ref 101–111)
Creatinine, Ser: 0.8 mg/dL (ref 0.44–1.00)
GFR calc Af Amer: >60 mL/min (ref >60)
GLUCOSE: 115 mg/dL — AB (ref 65–99)
POTASSIUM: 4.6 mmol/L (ref 3.5–5.1)
Sodium: 141 mmol/L (ref 135–145)

## 2016-03-29 NOTE — Discharge Summary (Signed)
[facility] Discharge Summary   Patient ID: Carla [name] MRN: [id number] DOB/AGE: April 19, 1950 66 y.o.  Admit date: 03/28/2016 Discharge date: 03/29/2016  Admitting Diagnosis: Multinodular thyroid goiter with compressive symptoms  Discharge Diagnosis Patient Active Problem List   Diagnosis Date Noted  . Goiter, nontoxic, multinodular 03/28/2016  . Multinodular goiter (nontoxic) 03/24/2016  . Hyperlipidemia 12/15/2013  . Depression 12/15/2013  . GERD (gastroesophageal reflux disease) 12/15/2013  . OA (osteoarthritis) of knee 12/12/2013  . Narcolepsy 07/25/2011    Consultants None  Imaging: No results found.  Procedures Dr. Harlow Asa (03/28/16) - Total thyroidectomy  Hospital Course:  Carla [name] is a 66 year old female who presented to [facility] for elective total thyroidectomy.  Patient underwent procedure listed above, tolerated procedure well and was admitted to the floor.  Diet was advanced as tolerated.  On POD#1, the patient was voiding well, tolerating diet, ambulating well, pain well controlled, vital signs stable, incisions c/d/i and felt stable for discharge home.  Patient will follow up in our office in 2 weeks and knows to call with questions or concerns.  She will call to confirm appointment date/time.    Patient was discharged in good condition.  The [place] Substance controlled database was reviewed prior to prescribing narcotic pain medication to this patient.  Physical Exam: General:  Alert, NAD, pleasant, cooperative, well appearing, speech normal, phonation normal Neck: good ROM, incision C/D/I Cardio: RRR, S1 & S2 normal, no murmur, rubs, gallops Resp: Effort normal, lungs CTA bilaterally, no wheezes, rales, rhonchi Abd:  Soft, ND, no tenderness Skin: warm and dry, not diaphoretic   Allergies as of 03/29/2016      Reactions   Codeine Other (See Comments)   For long periods of time--itching.    Other Other (See Comments)   Surgical Staples  - swelling at the site      Medication List    TAKE these medications   acetaminophen 500 MG tablet Commonly known as:  TYLENOL Take 500-1,000 mg by mouth every 8 (eight) hours as needed for moderate pain.   amphetamine-dextroamphetamine 20 MG tablet Commonly known as:  ADDERALL Take 20 mg by mouth 4 (four) times daily.   calcium carbonate 500 MG chewable tablet Commonly known as:  TUMS Chew 2 tablets (400 mg of elemental calcium total) by mouth 3 (three) times daily.   CLEAR EYES COMPLETE OP Apply 1-2 drops to eye daily as needed (dry eyes.).   HYDROcodone-acetaminophen 5-325 MG tablet Commonly known as:  NORCO/VICODIN Take 1-2 tablets by mouth every 4 (four) hours as needed for moderate pain.   levothyroxine 88 MCG tablet Commonly known as:  SYNTHROID Take 1 tablet (88 mcg total) by mouth daily before breakfast.   methocarbamol 500 MG tablet Commonly known as:  ROBAXIN Take 1 tablet by mouth 2 (two) times daily.   multivitamin-lutein Caps capsule Take 1 capsule by mouth daily.   pantoprazole 40 MG tablet Commonly known as:  PROTONIX Take 40 mg by mouth at bedtime.        Follow-up Information    GERKIN,TODD M, MD. Schedule an appointment as soon as possible for a visit in 3 week(s).   Specialty:  General Surgery Why:  For wound re-check Contact information: [place] [address] [phone]           Signed: [facility] 03/29/2016, 9:40 AM Pager: [phone] Consults: [phone] Mon-Fri 7:00 am-4:30 pm Sat-Sun 7:00 am-11:30 am

## 2016-03-29 NOTE — Progress Notes (Signed)
Pt ready for DC, DC instructions given and explained.  rx given and explained.  Pt understands to call on Monday for her appt.

## 2016-04-01 NOTE — Progress Notes (Signed)
Please contact patient and notify of benign pathology results.  Nalleli Largent M. Janya Eveland, MD, FACS [facility] Office: [phone]   

## 2016-04-09 DIAGNOSIS — E042 Nontoxic multinodular goiter: Secondary | ICD-10-CM | POA: Diagnosis not present

## 2016-05-07 DIAGNOSIS — Z8639 Personal history of other endocrine, nutritional and metabolic disease: Secondary | ICD-10-CM | POA: Diagnosis not present

## 2016-05-07 DIAGNOSIS — D692 Other nonthrombocytopenic purpura: Secondary | ICD-10-CM | POA: Diagnosis not present

## 2016-05-07 DIAGNOSIS — E890 Postprocedural hypothyroidism: Secondary | ICD-10-CM | POA: Diagnosis not present

## 2016-05-07 DIAGNOSIS — Z8349 Family history of other endocrine, nutritional and metabolic diseases: Secondary | ICD-10-CM | POA: Diagnosis not present

## 2016-05-07 DIAGNOSIS — E063 Autoimmune thyroiditis: Secondary | ICD-10-CM | POA: Diagnosis not present

## 2016-05-07 DIAGNOSIS — R490 Dysphonia: Secondary | ICD-10-CM | POA: Diagnosis not present

## 2016-05-08 DIAGNOSIS — Z86008 Personal history of in-situ neoplasm of other site: Secondary | ICD-10-CM | POA: Diagnosis not present

## 2016-05-08 DIAGNOSIS — D1801 Hemangioma of skin and subcutaneous tissue: Secondary | ICD-10-CM | POA: Diagnosis not present

## 2016-05-08 DIAGNOSIS — D485 Neoplasm of uncertain behavior of skin: Secondary | ICD-10-CM | POA: Diagnosis not present

## 2016-05-08 DIAGNOSIS — D225 Melanocytic nevi of trunk: Secondary | ICD-10-CM | POA: Diagnosis not present

## 2016-05-08 DIAGNOSIS — Z85828 Personal history of other malignant neoplasm of skin: Secondary | ICD-10-CM | POA: Diagnosis not present

## 2016-05-08 DIAGNOSIS — L821 Other seborrheic keratosis: Secondary | ICD-10-CM | POA: Diagnosis not present

## 2016-05-08 DIAGNOSIS — L814 Other melanin hyperpigmentation: Secondary | ICD-10-CM | POA: Diagnosis not present

## 2016-05-21 DIAGNOSIS — G47419 Narcolepsy without cataplexy: Secondary | ICD-10-CM | POA: Diagnosis not present

## 2016-05-21 DIAGNOSIS — G4733 Obstructive sleep apnea (adult) (pediatric): Secondary | ICD-10-CM | POA: Diagnosis not present

## 2016-07-03 DIAGNOSIS — E049 Nontoxic goiter, unspecified: Secondary | ICD-10-CM | POA: Diagnosis not present

## 2016-07-03 DIAGNOSIS — E890 Postprocedural hypothyroidism: Secondary | ICD-10-CM | POA: Diagnosis not present

## 2016-07-03 DIAGNOSIS — L659 Nonscarring hair loss, unspecified: Secondary | ICD-10-CM | POA: Diagnosis not present

## 2016-10-09 DIAGNOSIS — E890 Postprocedural hypothyroidism: Secondary | ICD-10-CM | POA: Diagnosis not present

## 2016-11-05 DIAGNOSIS — G47419 Narcolepsy without cataplexy: Secondary | ICD-10-CM | POA: Diagnosis not present

## 2016-11-05 DIAGNOSIS — G4733 Obstructive sleep apnea (adult) (pediatric): Secondary | ICD-10-CM | POA: Diagnosis not present

## 2016-11-11 DIAGNOSIS — Z8639 Personal history of other endocrine, nutritional and metabolic disease: Secondary | ICD-10-CM | POA: Diagnosis not present

## 2016-11-11 DIAGNOSIS — E890 Postprocedural hypothyroidism: Secondary | ICD-10-CM | POA: Diagnosis not present

## 2016-11-11 DIAGNOSIS — E063 Autoimmune thyroiditis: Secondary | ICD-10-CM | POA: Diagnosis not present

## 2016-11-11 DIAGNOSIS — Z8349 Family history of other endocrine, nutritional and metabolic diseases: Secondary | ICD-10-CM | POA: Diagnosis not present

## 2016-11-12 DIAGNOSIS — D225 Melanocytic nevi of trunk: Secondary | ICD-10-CM | POA: Diagnosis not present

## 2016-11-12 DIAGNOSIS — Z86008 Personal history of in-situ neoplasm of other site: Secondary | ICD-10-CM | POA: Diagnosis not present

## 2016-11-12 DIAGNOSIS — D485 Neoplasm of uncertain behavior of skin: Secondary | ICD-10-CM | POA: Diagnosis not present

## 2016-11-12 DIAGNOSIS — L570 Actinic keratosis: Secondary | ICD-10-CM | POA: Diagnosis not present

## 2016-11-12 DIAGNOSIS — D1801 Hemangioma of skin and subcutaneous tissue: Secondary | ICD-10-CM | POA: Diagnosis not present

## 2016-11-12 DIAGNOSIS — L814 Other melanin hyperpigmentation: Secondary | ICD-10-CM | POA: Diagnosis not present

## 2016-11-12 DIAGNOSIS — Z85828 Personal history of other malignant neoplasm of skin: Secondary | ICD-10-CM | POA: Diagnosis not present

## 2016-11-12 DIAGNOSIS — Z1231 Encounter for screening mammogram for malignant neoplasm of breast: Secondary | ICD-10-CM | POA: Diagnosis not present

## 2016-11-12 DIAGNOSIS — L821 Other seborrheic keratosis: Secondary | ICD-10-CM | POA: Diagnosis not present

## 2016-11-24 DIAGNOSIS — H52223 Regular astigmatism, bilateral: Secondary | ICD-10-CM | POA: Diagnosis not present

## 2016-11-24 DIAGNOSIS — Z961 Presence of intraocular lens: Secondary | ICD-10-CM | POA: Diagnosis not present

## 2016-11-24 DIAGNOSIS — H04123 Dry eye syndrome of bilateral lacrimal glands: Secondary | ICD-10-CM | POA: Diagnosis not present

## 2016-11-24 DIAGNOSIS — H26493 Other secondary cataract, bilateral: Secondary | ICD-10-CM | POA: Diagnosis not present

## 2016-11-30 DIAGNOSIS — M25561 Pain in right knee: Secondary | ICD-10-CM | POA: Diagnosis not present

## 2016-11-30 DIAGNOSIS — M25511 Pain in right shoulder: Secondary | ICD-10-CM | POA: Diagnosis not present

## 2016-11-30 DIAGNOSIS — M25521 Pain in right elbow: Secondary | ICD-10-CM | POA: Diagnosis not present

## 2016-12-09 DIAGNOSIS — M25561 Pain in right knee: Secondary | ICD-10-CM | POA: Diagnosis not present

## 2016-12-10 DIAGNOSIS — G4733 Obstructive sleep apnea (adult) (pediatric): Secondary | ICD-10-CM | POA: Diagnosis not present

## 2016-12-19 DIAGNOSIS — M25561 Pain in right knee: Secondary | ICD-10-CM | POA: Diagnosis not present

## 2016-12-19 DIAGNOSIS — S86911A Strain of unspecified muscle(s) and tendon(s) at lower leg level, right leg, initial encounter: Secondary | ICD-10-CM | POA: Diagnosis not present

## 2016-12-19 DIAGNOSIS — M25661 Stiffness of right knee, not elsewhere classified: Secondary | ICD-10-CM | POA: Diagnosis not present

## 2016-12-19 DIAGNOSIS — R262 Difficulty in walking, not elsewhere classified: Secondary | ICD-10-CM | POA: Diagnosis not present

## 2016-12-22 DIAGNOSIS — R262 Difficulty in walking, not elsewhere classified: Secondary | ICD-10-CM | POA: Diagnosis not present

## 2016-12-22 DIAGNOSIS — M25561 Pain in right knee: Secondary | ICD-10-CM | POA: Diagnosis not present

## 2016-12-22 DIAGNOSIS — M25661 Stiffness of right knee, not elsewhere classified: Secondary | ICD-10-CM | POA: Diagnosis not present

## 2017-01-01 DIAGNOSIS — M25561 Pain in right knee: Secondary | ICD-10-CM | POA: Diagnosis not present

## 2017-01-01 DIAGNOSIS — R262 Difficulty in walking, not elsewhere classified: Secondary | ICD-10-CM | POA: Diagnosis not present

## 2017-01-01 DIAGNOSIS — M25661 Stiffness of right knee, not elsewhere classified: Secondary | ICD-10-CM | POA: Diagnosis not present

## 2017-01-02 DIAGNOSIS — E890 Postprocedural hypothyroidism: Secondary | ICD-10-CM | POA: Diagnosis not present

## 2017-01-05 DIAGNOSIS — S86911D Strain of unspecified muscle(s) and tendon(s) at lower leg level, right leg, subsequent encounter: Secondary | ICD-10-CM | POA: Diagnosis not present

## 2017-01-05 DIAGNOSIS — R262 Difficulty in walking, not elsewhere classified: Secondary | ICD-10-CM | POA: Diagnosis not present

## 2017-01-05 DIAGNOSIS — M25561 Pain in right knee: Secondary | ICD-10-CM | POA: Diagnosis not present

## 2017-01-05 DIAGNOSIS — M25661 Stiffness of right knee, not elsewhere classified: Secondary | ICD-10-CM | POA: Diagnosis not present

## 2017-01-13 DIAGNOSIS — Z96659 Presence of unspecified artificial knee joint: Secondary | ICD-10-CM | POA: Diagnosis not present

## 2017-01-13 DIAGNOSIS — T8484XA Pain due to internal orthopedic prosthetic devices, implants and grafts, initial encounter: Secondary | ICD-10-CM | POA: Diagnosis not present

## 2017-01-13 DIAGNOSIS — M25561 Pain in right knee: Secondary | ICD-10-CM | POA: Diagnosis not present

## 2017-01-30 DIAGNOSIS — D692 Other nonthrombocytopenic purpura: Secondary | ICD-10-CM | POA: Diagnosis not present

## 2017-01-30 DIAGNOSIS — F3340 Major depressive disorder, recurrent, in remission, unspecified: Secondary | ICD-10-CM | POA: Diagnosis not present

## 2017-01-30 DIAGNOSIS — J069 Acute upper respiratory infection, unspecified: Secondary | ICD-10-CM | POA: Diagnosis not present

## 2017-01-30 DIAGNOSIS — E785 Hyperlipidemia, unspecified: Secondary | ICD-10-CM | POA: Diagnosis not present

## 2017-01-30 DIAGNOSIS — E559 Vitamin D deficiency, unspecified: Secondary | ICD-10-CM | POA: Diagnosis not present

## 2017-01-30 DIAGNOSIS — Z23 Encounter for immunization: Secondary | ICD-10-CM | POA: Diagnosis not present

## 2017-01-30 DIAGNOSIS — Z1159 Encounter for screening for other viral diseases: Secondary | ICD-10-CM | POA: Diagnosis not present

## 2017-01-30 DIAGNOSIS — I10 Essential (primary) hypertension: Secondary | ICD-10-CM | POA: Diagnosis not present

## 2017-01-30 DIAGNOSIS — Z79899 Other long term (current) drug therapy: Secondary | ICD-10-CM | POA: Diagnosis not present

## 2017-01-30 DIAGNOSIS — Z0000 Encounter for general adult medical examination without abnormal findings: Secondary | ICD-10-CM | POA: Diagnosis not present

## 2017-01-30 DIAGNOSIS — G47419 Narcolepsy without cataplexy: Secondary | ICD-10-CM | POA: Diagnosis not present

## 2017-04-29 IMAGING — US US THYROID
1 series · 12 of 25 positions shown · non-contrast
Comparison: 12/03/2011, 11/19/2011

CLINICAL DATA: 65-year-old female with a history of thyroid nodules
and goiter.

Prior ultrasound-guided biopsy performed of right thyroid nodule
12/03/2011
EXAM:
THYROID ULTRASOUND
TECHNIQUE: Ultrasound examination of the thyroid gland and adjacent soft
tissues was performed.

[Series 1: us thyroid · 0.05mm/px · 12 of 54 slices shown]
[im 3/54]
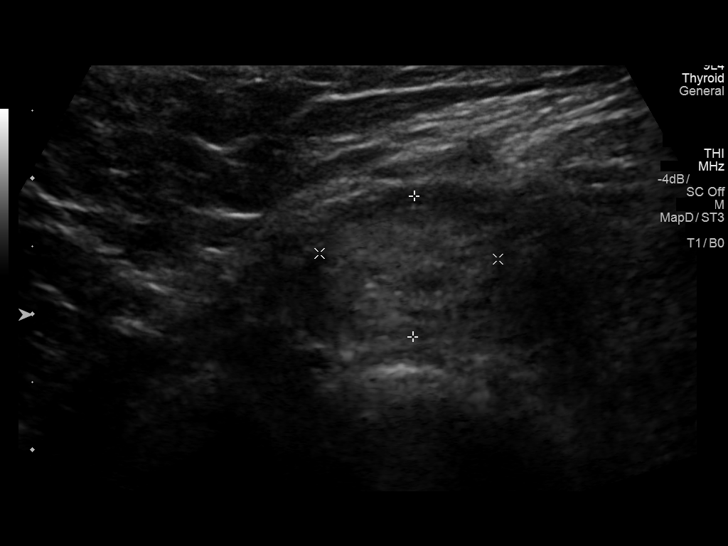
[im 7/54]
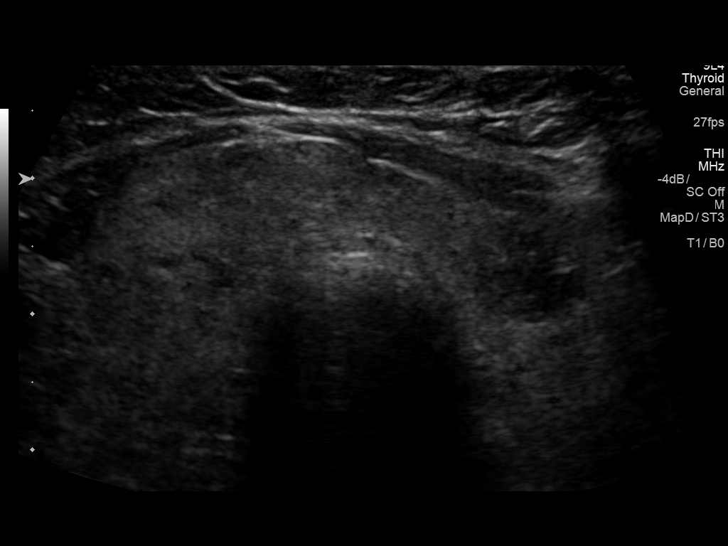
[im 12/54]
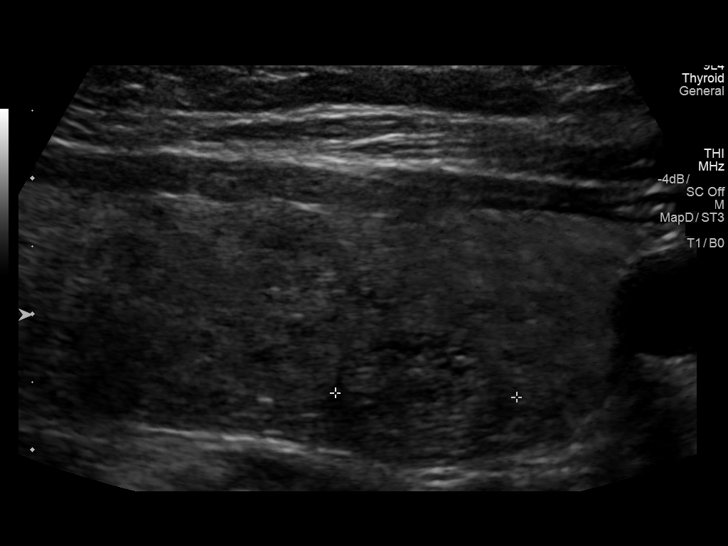
[im 16/54]
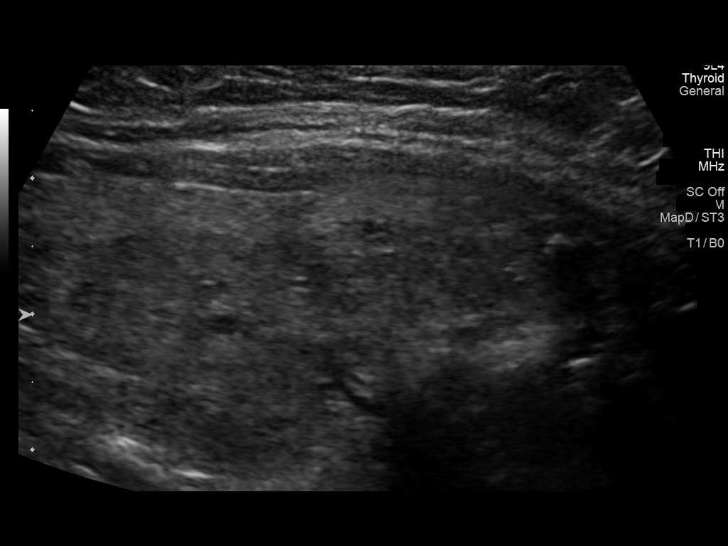
[im 20/54]
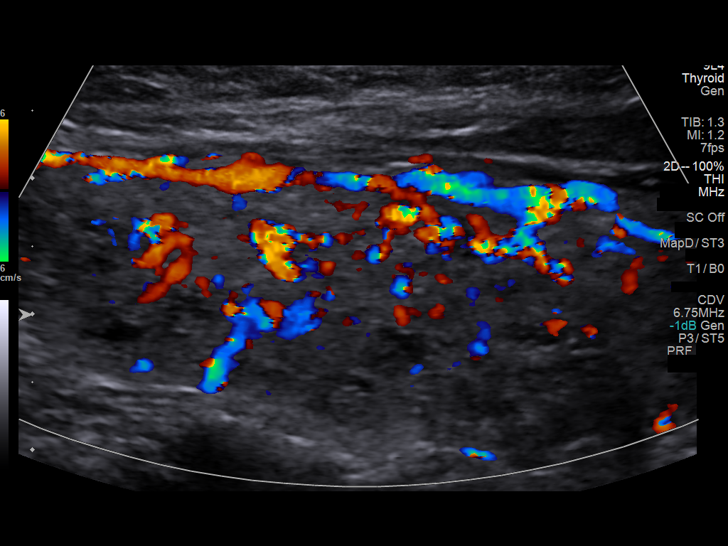
[im 25/54]
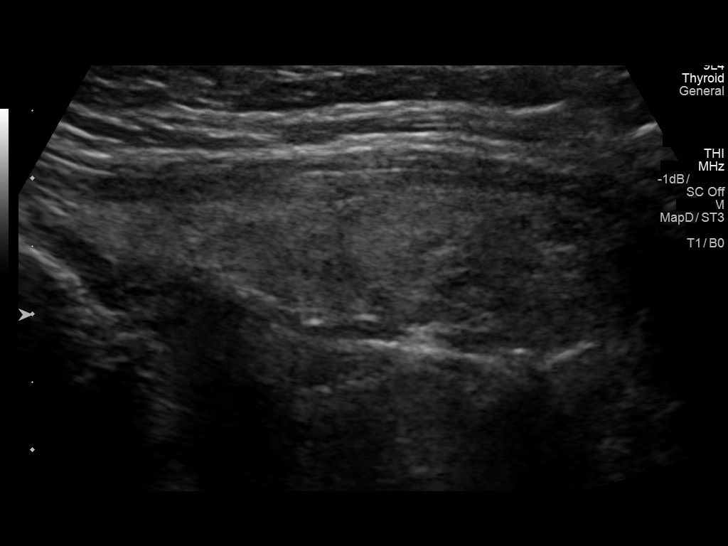
[im 29/54]
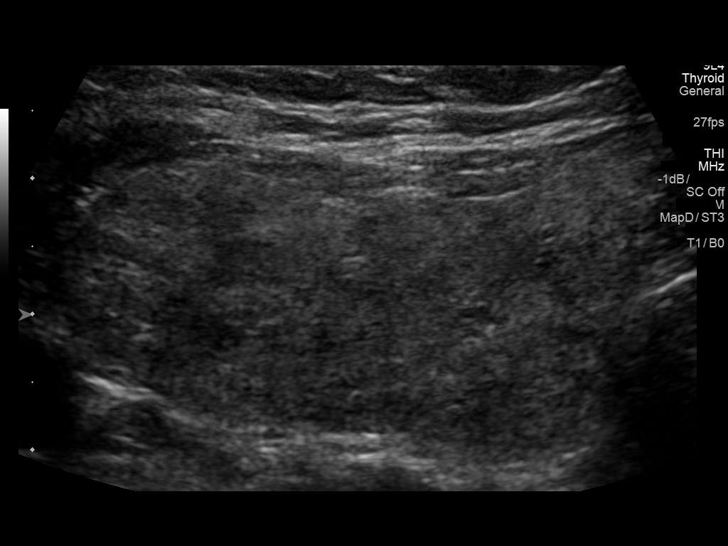
[im 34/54]
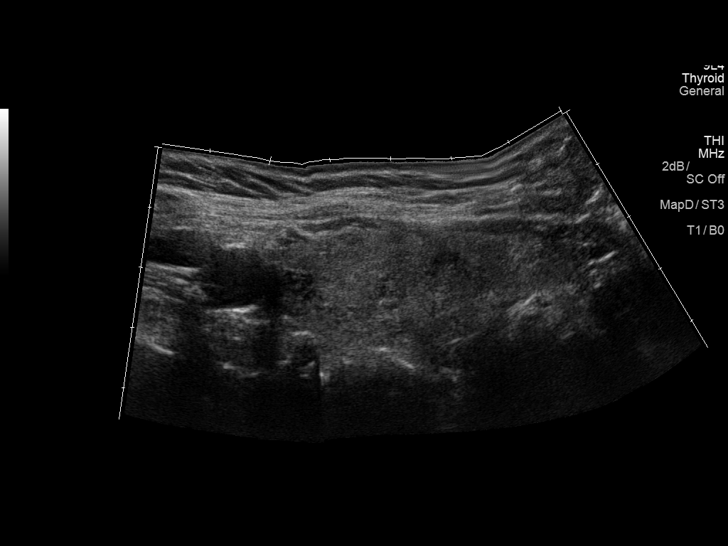
[im 38/54]
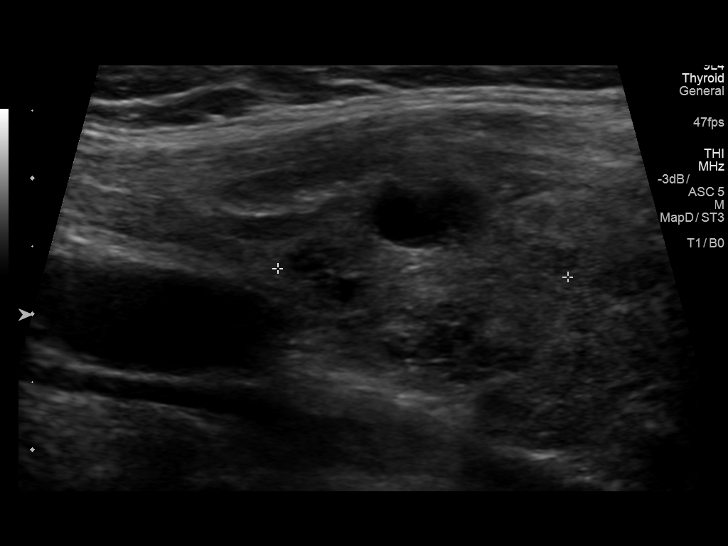
[im 42/54]
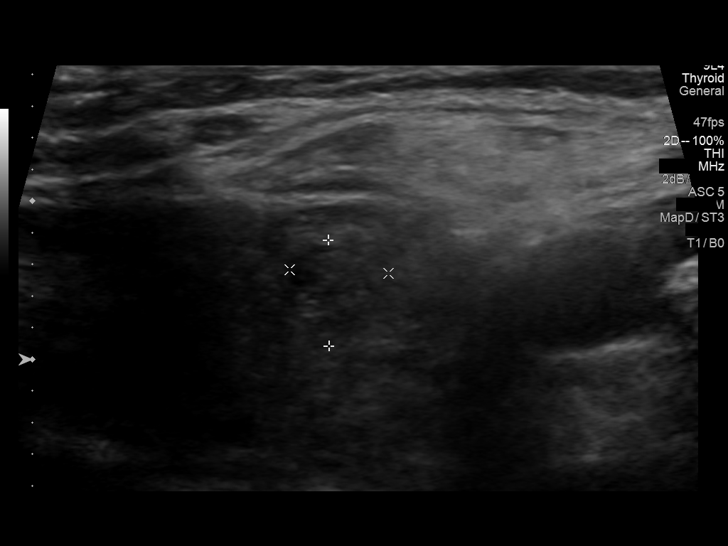
[im 47/54]
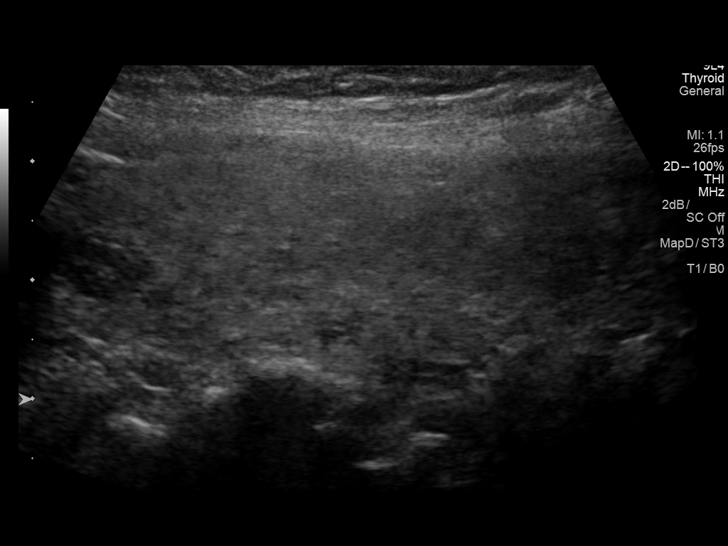
[im 51/54]
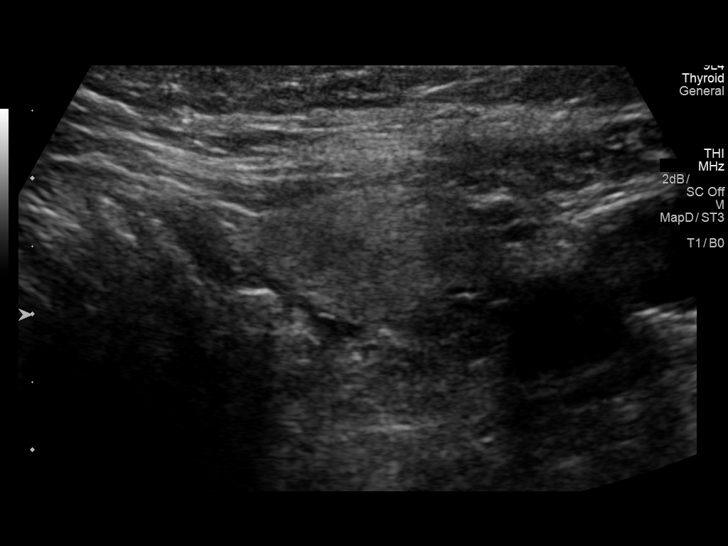

[12 of 25 positions shown; findings below may reference images not displayed]

FINDINGS: Parenchymal Echotexture: Moderately heterogenous

Isthmus: 0.7 cm

Right lobe: 5.6 cm x 2.3 cm x 4.2 cm

Left lobe: 6.1 cm x 2.4 cm x 2.6 cm

_________________________________________________________

Estimated total number of nodules >/= 1 cm: 4

Number of spongiform nodules >/=  2 cm not described below (TR1): 0

Number of mixed cystic and solid nodules >/= 1.5 cm not described
below (TR2): 0

Nodule # 1:

Location: Isthmus; Mid

Maximum size: 1.9 cm; Other 2 dimensions: 1.0 cm x 1.3 cm

Composition: solid/almost completely solid (2)

Echogenicity: isoechoic (1)

Shape: not taller-than-wide (0)

Margins: ill-defined (0)

Echogenic foci: none (0)

ACR TI-RADS total points: 3.

ACR TI-RADS risk category: TR3 (3 points).

ACR TI-RADS recommendations:

Nodule meets criteria for surveillance

Nodule # 2:

Location: Isthmus; Mid

Maximum size: 1.8 cm; Other 2 dimensions: 1.1 cm x 1.3 cm

Composition: solid/almost completely solid (2)

Echogenicity: isoechoic (1)

Shape: not taller-than-wide (0)

Margins: ill-defined (0)

Echogenic foci: none (0)

ACR TI-RADS total points: 3.

ACR TI-RADS risk category: TR3 (3 points).

ACR TI-RADS recommendations:

Nodule meets criteria for surveillance

Nodule # 1:

Location: Right; Inferior

Maximum size: 1.3 cm; Other 2 dimensions: 1.0 cm x 1.1 cm

Composition: spongiform (0)

ACR TI-RADS recommendations:

Spongiform nodule does not require surveillance or biopsy

Nodule # 1:

Location: Isthmus; Superior

Maximum size: 0.9 cm; Other 2 dimensions: 0.6 cm x 0.8 cm

Composition: spongiform (0)

ACR TI-RADS recommendations:

Spongiform nodule does not require surveillance or biopsy

Nodule # 2:

Location: Left; Mid

Maximum size: 2.1 cm; Other 2 dimensions: 1.7 cm x 1.9 cm

Composition: mixed cystic and solid (1)

Echogenicity: isoechoic (1)

Shape: not taller-than-wide (0)

Margins: ill-defined (0)

Echogenic foci: none (0)

ACR TI-RADS total points: 2.

ACR TI-RADS risk category: TR2 (2 points).

ACR TI-RADS recommendations:

Nodule does not meet criteria for surveillance or biopsy

Nodule # 3:

Location: Left; Inferior

Maximum size: 0.9 cm; Other 2 dimensions: 0.7 cm x 0.6 cm

Composition: spongiform (0)

ACR TI-RADS recommendations:

Spongiform nodule does not meet criteria for surveillance or biopsy
IMPRESSION: Two isthmic thyroid nodules meet criteria for surveillance, as
designated by the newly established ACR TI-RADS criteria.
Surveillance ultrasound study recommended to be performed annually
up to 5 years.

Remainder of the thyroid nodules do not meet criteria for
surveillance or biopsy.

Recommendations follow those established by the new ACR TI-RADS
criteria ([HOSPITAL] 3898;[DATE]).

## 2017-05-12 DIAGNOSIS — G4733 Obstructive sleep apnea (adult) (pediatric): Secondary | ICD-10-CM | POA: Diagnosis not present

## 2017-05-12 DIAGNOSIS — G47419 Narcolepsy without cataplexy: Secondary | ICD-10-CM | POA: Diagnosis not present

## 2017-05-26 DIAGNOSIS — Z8349 Family history of other endocrine, nutritional and metabolic diseases: Secondary | ICD-10-CM | POA: Diagnosis not present

## 2017-05-26 DIAGNOSIS — E785 Hyperlipidemia, unspecified: Secondary | ICD-10-CM | POA: Diagnosis not present

## 2017-05-26 DIAGNOSIS — E063 Autoimmune thyroiditis: Secondary | ICD-10-CM | POA: Diagnosis not present

## 2017-05-26 DIAGNOSIS — E890 Postprocedural hypothyroidism: Secondary | ICD-10-CM | POA: Diagnosis not present

## 2017-05-26 DIAGNOSIS — Z8639 Personal history of other endocrine, nutritional and metabolic disease: Secondary | ICD-10-CM | POA: Diagnosis not present

## 2017-08-12 IMAGING — CR DG CHEST 2V
2 series · 2 of 2 positions shown · non-contrast
Comparison: 12/12/2013 chest radiograph.

CLINICAL DATA: Preoperative for thyroid surgery

EXAM:
CHEST  2 VIEW

[w chest pa]
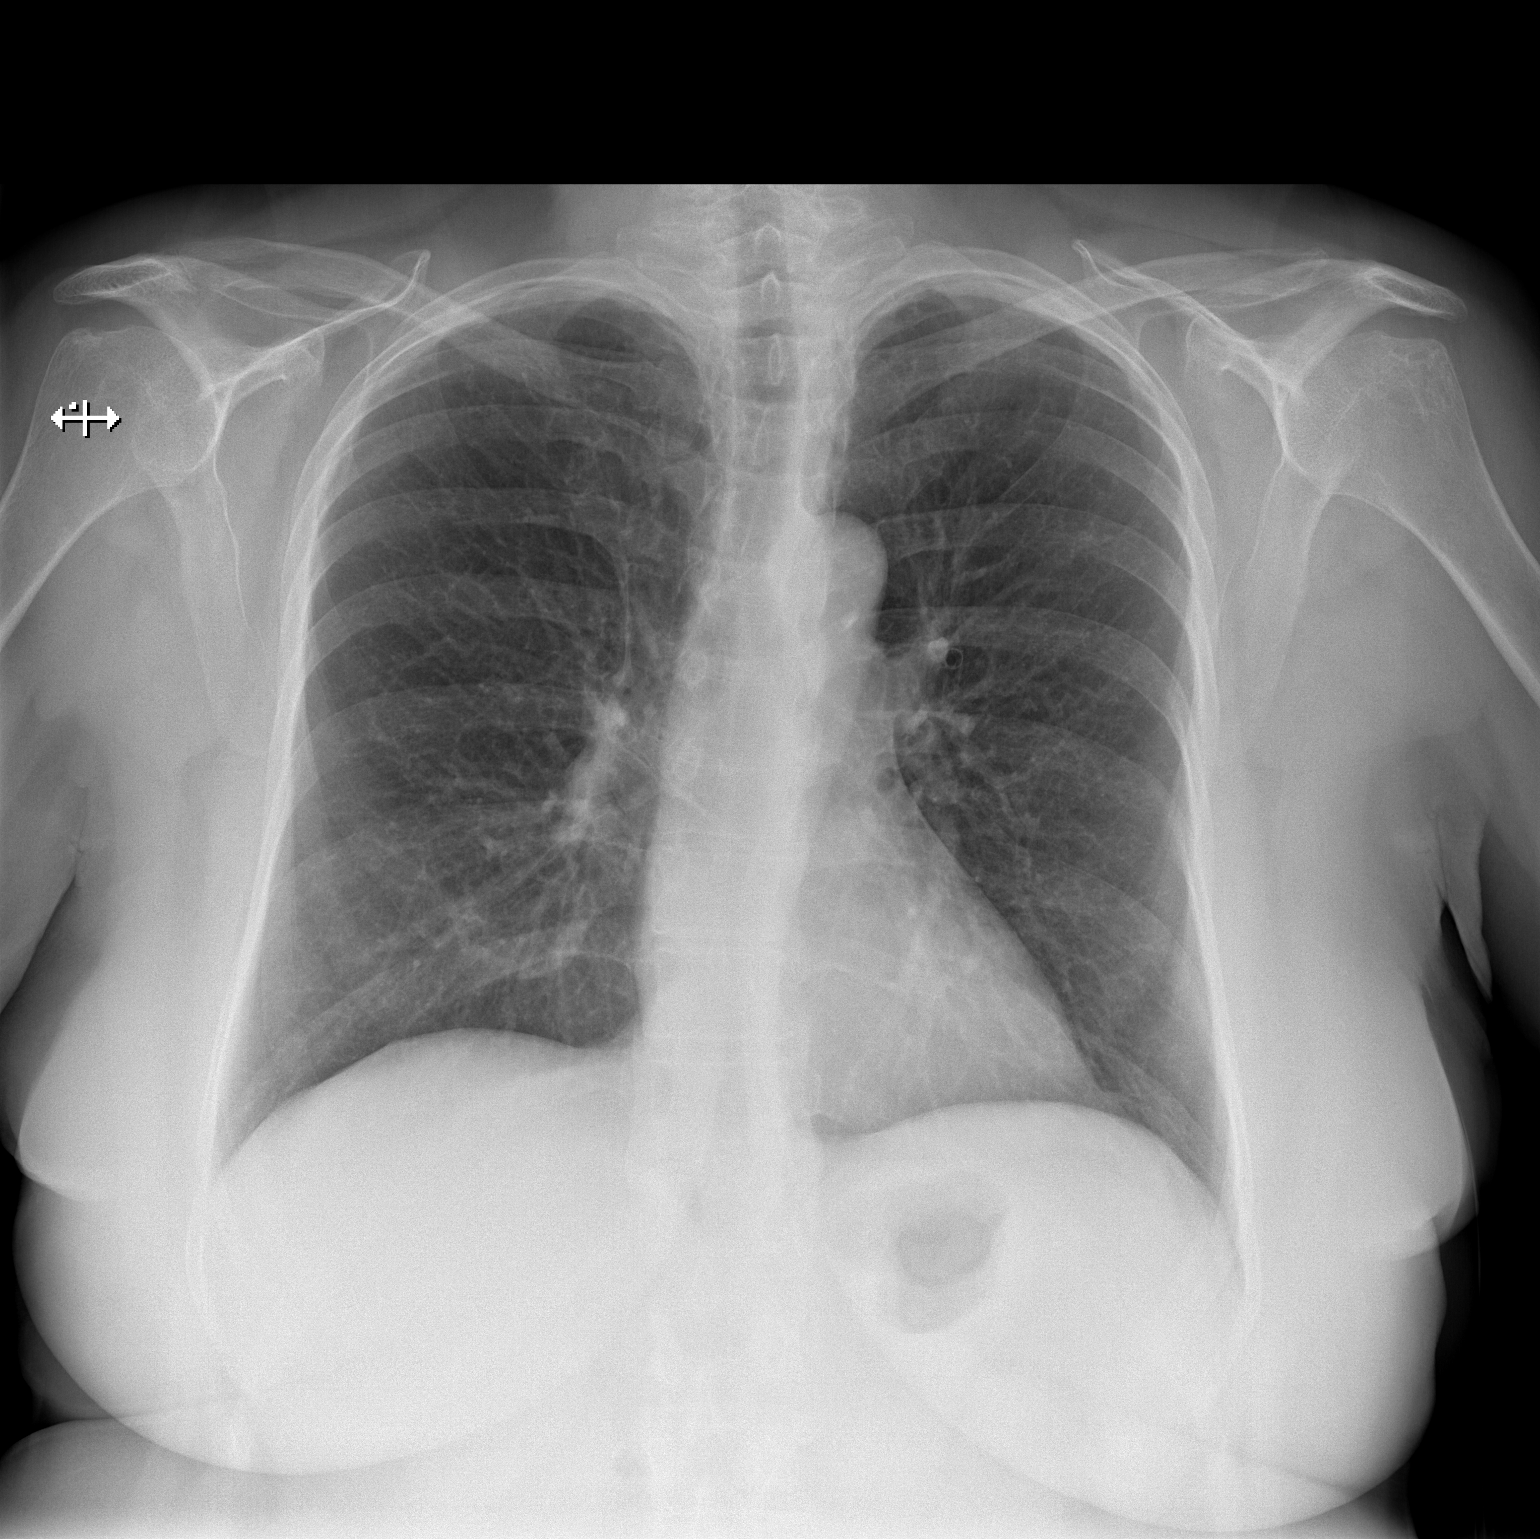

[w chest lat]
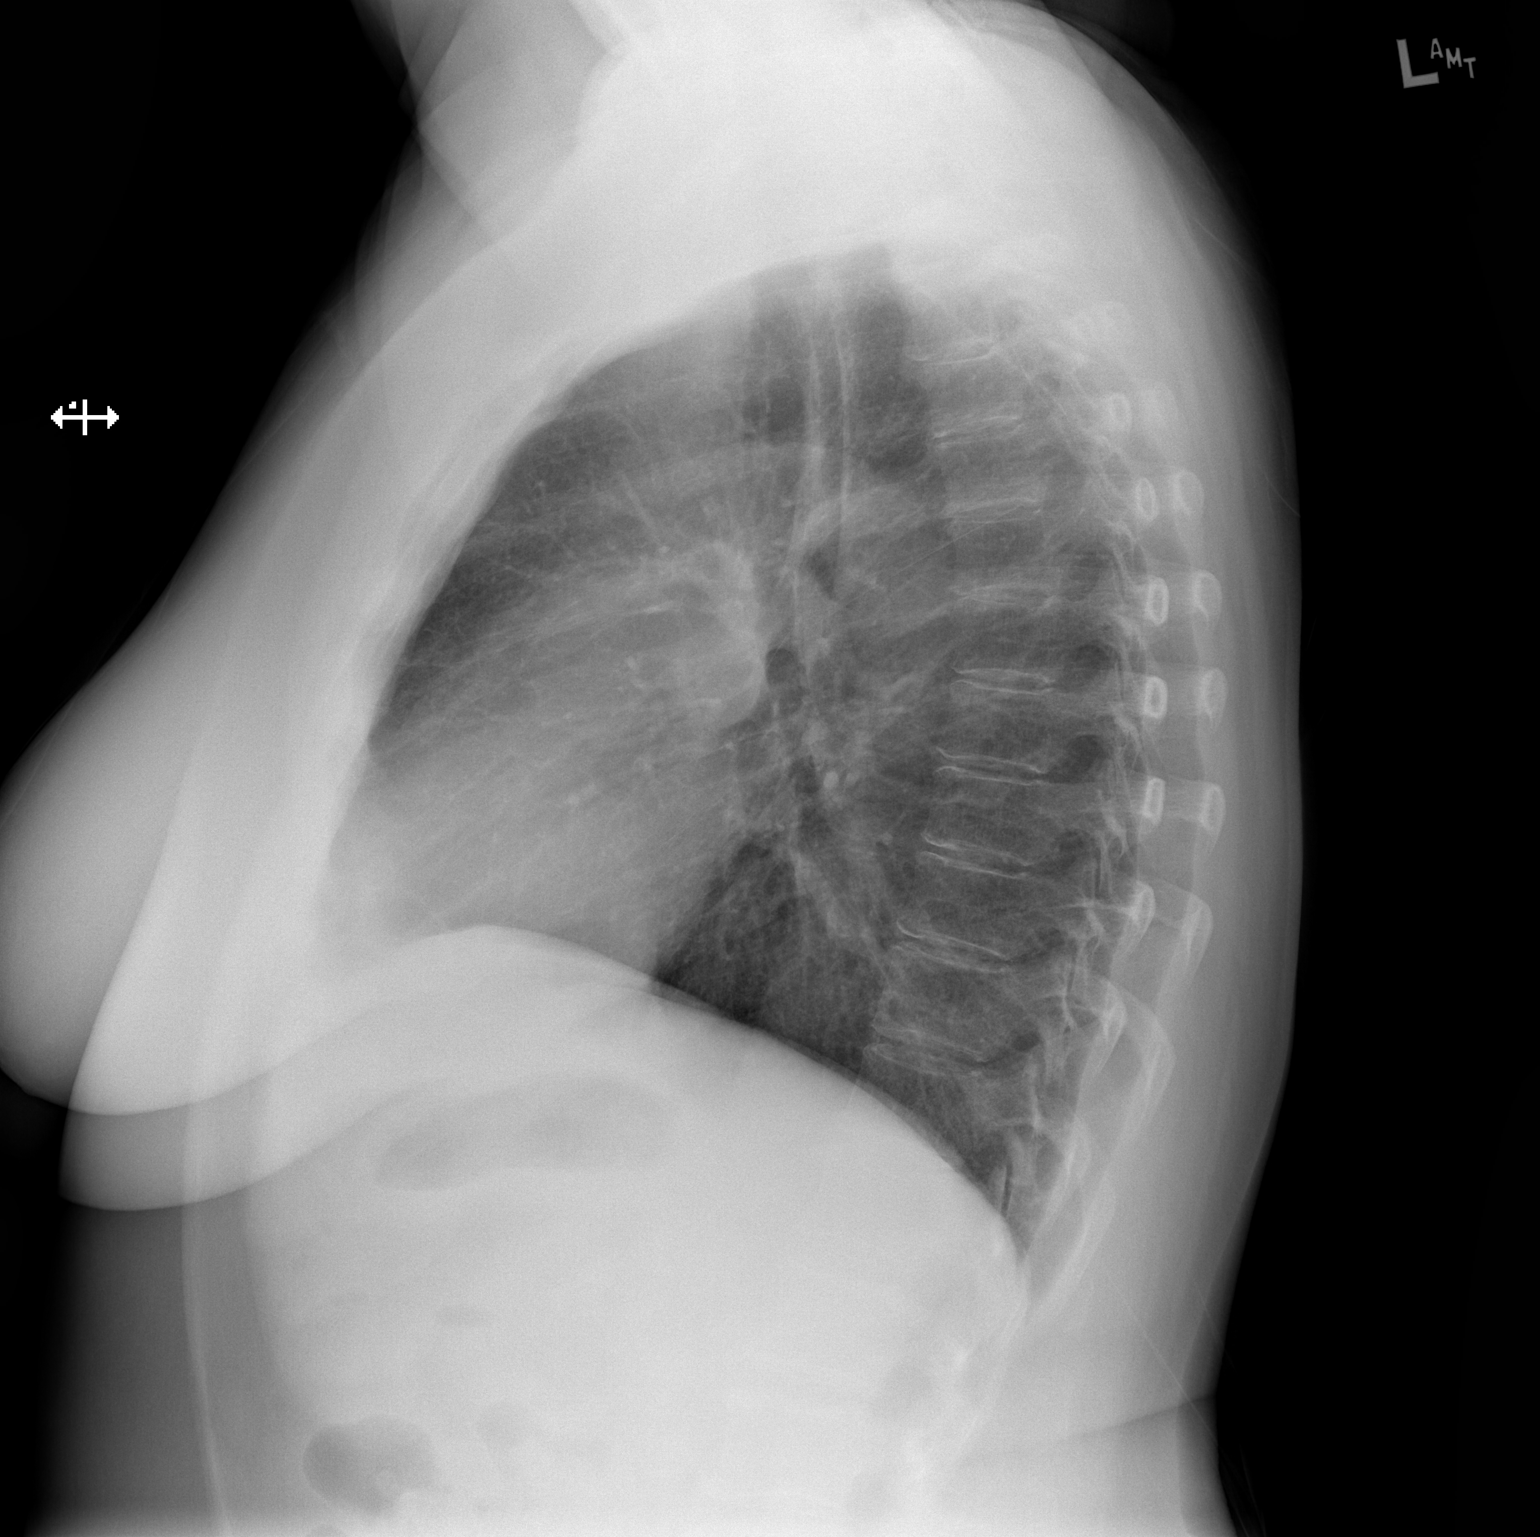

[2 of 2 positions shown; findings below may reference images not displayed]

FINDINGS: Stable cardiomediastinal silhouette with normal heart size and
aortic atherosclerosis. No pneumothorax. No pleural effusion. Lungs
appear clear, with no acute consolidative airspace disease and no
pulmonary edema.
IMPRESSION: No active cardiopulmonary disease.

Aortic atherosclerosis.

## 2017-11-17 DIAGNOSIS — L821 Other seborrheic keratosis: Secondary | ICD-10-CM | POA: Diagnosis not present

## 2017-11-17 DIAGNOSIS — Z08 Encounter for follow-up examination after completed treatment for malignant neoplasm: Secondary | ICD-10-CM | POA: Diagnosis not present

## 2017-11-17 DIAGNOSIS — L814 Other melanin hyperpigmentation: Secondary | ICD-10-CM | POA: Diagnosis not present

## 2017-11-17 DIAGNOSIS — Z872 Personal history of diseases of the skin and subcutaneous tissue: Secondary | ICD-10-CM | POA: Diagnosis not present

## 2017-11-17 DIAGNOSIS — Z419 Encounter for procedure for purposes other than remedying health state, unspecified: Secondary | ICD-10-CM | POA: Diagnosis not present

## 2017-11-17 DIAGNOSIS — Z85828 Personal history of other malignant neoplasm of skin: Secondary | ICD-10-CM | POA: Diagnosis not present

## 2017-12-09 DIAGNOSIS — H52223 Regular astigmatism, bilateral: Secondary | ICD-10-CM | POA: Diagnosis not present

## 2017-12-09 DIAGNOSIS — H26493 Other secondary cataract, bilateral: Secondary | ICD-10-CM | POA: Diagnosis not present

## 2017-12-09 DIAGNOSIS — Z961 Presence of intraocular lens: Secondary | ICD-10-CM | POA: Diagnosis not present

## 2017-12-14 DIAGNOSIS — G4733 Obstructive sleep apnea (adult) (pediatric): Secondary | ICD-10-CM | POA: Diagnosis not present

## 2017-12-14 DIAGNOSIS — E663 Overweight: Secondary | ICD-10-CM | POA: Diagnosis not present

## 2017-12-14 DIAGNOSIS — G47419 Narcolepsy without cataplexy: Secondary | ICD-10-CM | POA: Diagnosis not present

## 2018-03-10 DIAGNOSIS — K219 Gastro-esophageal reflux disease without esophagitis: Secondary | ICD-10-CM | POA: Diagnosis not present

## 2018-03-10 DIAGNOSIS — R030 Elevated blood-pressure reading, without diagnosis of hypertension: Secondary | ICD-10-CM | POA: Diagnosis not present

## 2018-03-10 DIAGNOSIS — E7800 Pure hypercholesterolemia, unspecified: Secondary | ICD-10-CM | POA: Diagnosis not present

## 2018-03-10 DIAGNOSIS — Z820 Family history of epilepsy and other diseases of the nervous system: Secondary | ICD-10-CM | POA: Diagnosis not present

## 2018-03-10 DIAGNOSIS — E890 Postprocedural hypothyroidism: Secondary | ICD-10-CM | POA: Diagnosis not present

## 2018-03-10 DIAGNOSIS — N898 Other specified noninflammatory disorders of vagina: Secondary | ICD-10-CM | POA: Diagnosis not present

## 2018-03-16 DIAGNOSIS — Z1231 Encounter for screening mammogram for malignant neoplasm of breast: Secondary | ICD-10-CM | POA: Diagnosis not present

## 2018-03-16 DIAGNOSIS — Z1239 Encounter for other screening for malignant neoplasm of breast: Secondary | ICD-10-CM | POA: Diagnosis not present
